# Patient Record
Sex: Female | Born: 1980 | Race: Black or African American | Hispanic: No | Marital: Single | Smoking: Never smoker
Health system: Southern US, Community
[De-identification: ages and names within clinical notes are randomized; demographics above are authoritative.]

## PROBLEM LIST (undated history)

## (undated) DIAGNOSIS — F32A Depression, unspecified: Secondary | ICD-10-CM

## (undated) DIAGNOSIS — F329 Major depressive disorder, single episode, unspecified: Secondary | ICD-10-CM

## (undated) DIAGNOSIS — J45909 Unspecified asthma, uncomplicated: Secondary | ICD-10-CM

## (undated) DIAGNOSIS — K219 Gastro-esophageal reflux disease without esophagitis: Secondary | ICD-10-CM

## (undated) DIAGNOSIS — E039 Hypothyroidism, unspecified: Secondary | ICD-10-CM

## (undated) DIAGNOSIS — D649 Anemia, unspecified: Secondary | ICD-10-CM

## (undated) DIAGNOSIS — Z9289 Personal history of other medical treatment: Secondary | ICD-10-CM

## (undated) HISTORY — PX: UPPER GI ENDOSCOPY: SHX6162

---

## 1999-02-03 ENCOUNTER — Other Ambulatory Visit: Admission: RE | Admit: 1999-02-03 | Discharge: 1999-02-03 | Payer: Self-pay | Admitting: *Deleted

## 2005-12-03 ENCOUNTER — Emergency Department (HOSPITAL_COMMUNITY): Admission: EM | Admit: 2005-12-03 | Discharge: 2005-12-04 | Payer: Self-pay | Admitting: Emergency Medicine

## 2005-12-04 ENCOUNTER — Emergency Department (HOSPITAL_COMMUNITY): Admission: EM | Admit: 2005-12-04 | Discharge: 2005-12-04 | Payer: Self-pay | Admitting: Emergency Medicine

## 2006-02-08 ENCOUNTER — Other Ambulatory Visit: Admission: RE | Admit: 2006-02-08 | Discharge: 2006-02-08 | Payer: Self-pay | Admitting: Family Medicine

## 2006-03-03 ENCOUNTER — Encounter: Admission: RE | Admit: 2006-03-03 | Discharge: 2006-03-03 | Payer: Self-pay | Admitting: Family Medicine

## 2006-10-25 ENCOUNTER — Other Ambulatory Visit: Admission: RE | Admit: 2006-10-25 | Discharge: 2006-10-25 | Payer: Self-pay | Admitting: Family Medicine

## 2007-01-01 ENCOUNTER — Emergency Department (HOSPITAL_COMMUNITY): Admission: EM | Admit: 2007-01-01 | Discharge: 2007-01-01 | Payer: Self-pay | Admitting: Emergency Medicine

## 2007-02-22 IMAGING — US US ABDOMEN COMPLETE
1 series · 14 of 25 positions shown · non-contrast
Comparison: None.

CLINICAL DATA: 24-year-old female with severe abdominal pain.  
ABDOMEN ULTRASOUND:
TECHNIQUE: Complete abdominal ultrasound examination was performed including evaluation of the liver, gallbladder, bile ducts, pancreas, kidneys, spleen, IVC, and abdominal aorta.

[Series 1: abdomen · 0.28mm/px · 14 of 78 slices shown]
[im 1/78]
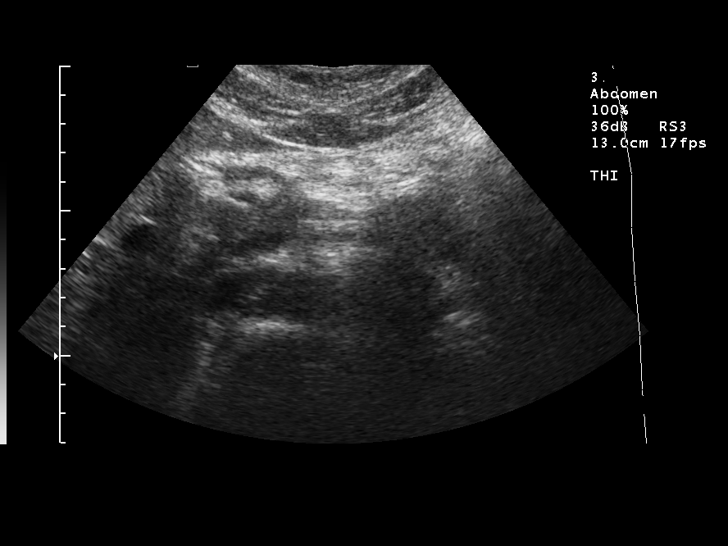
[im 7/78]
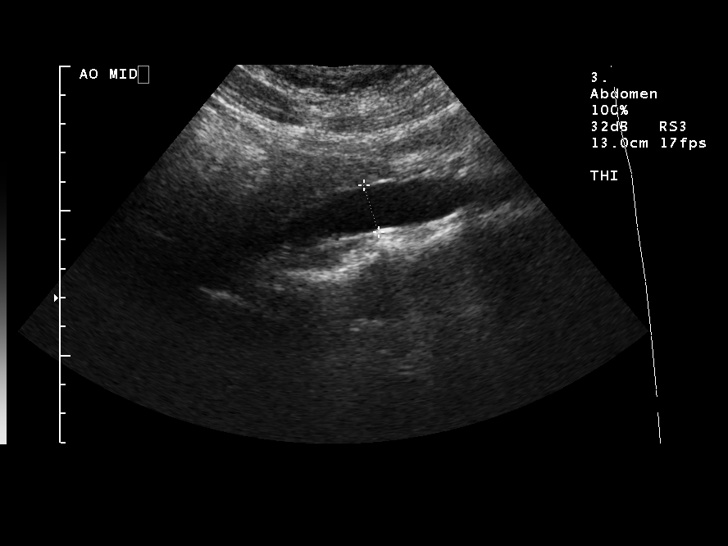
[im 13/78]
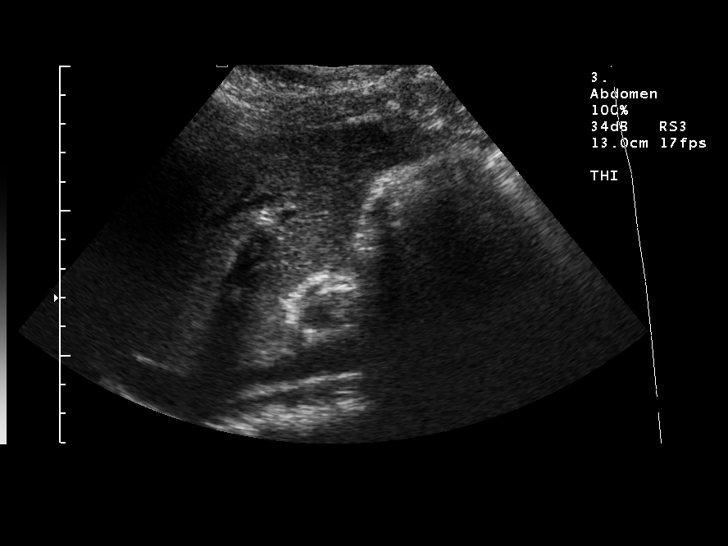
[im 20/78]
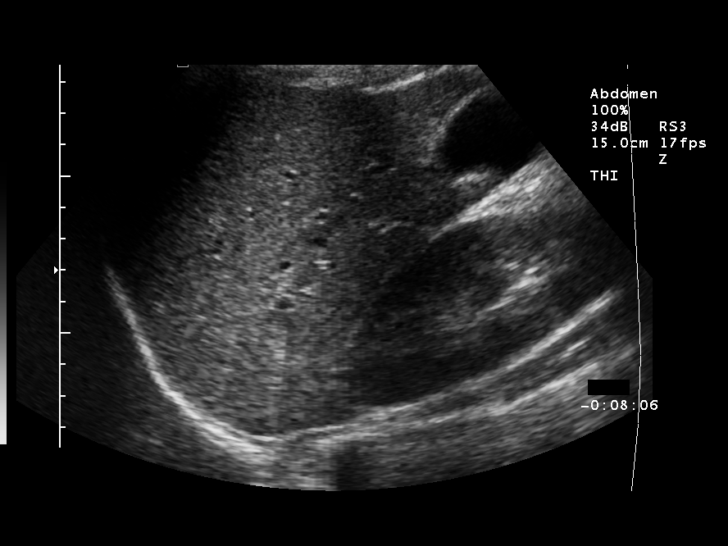
[im 26/78]
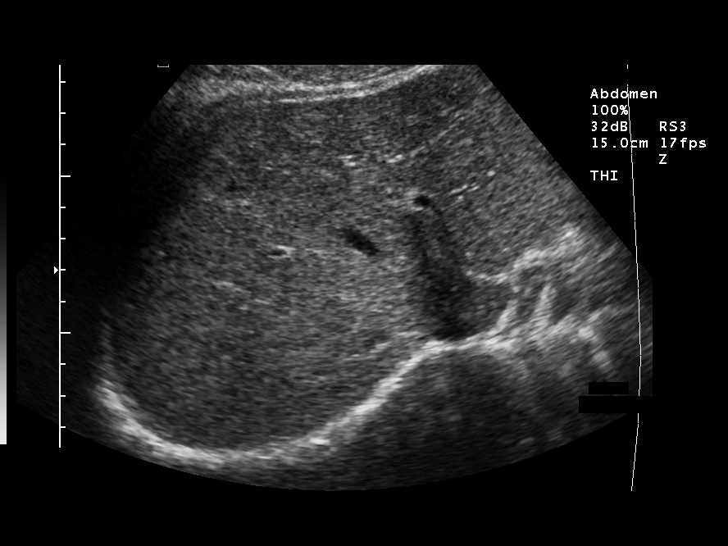
[im 29/78]
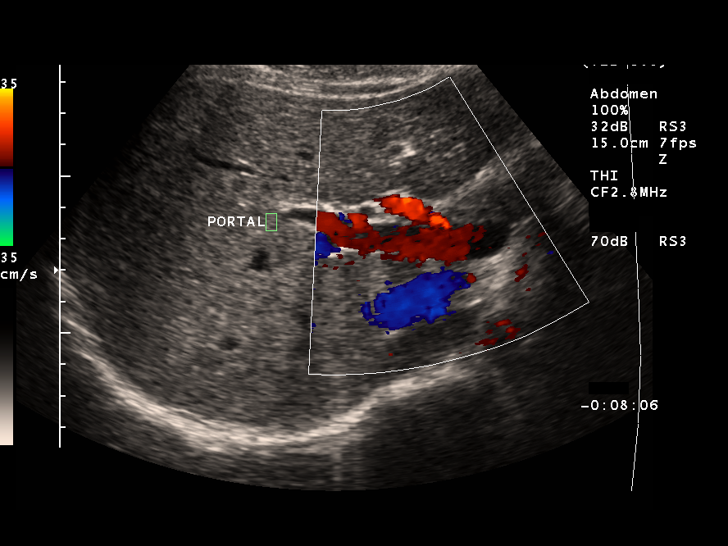
[im 36/78]
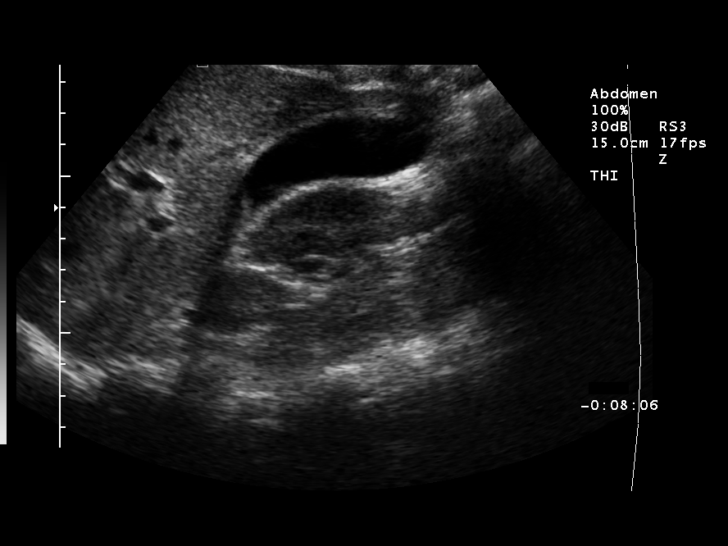
[im 42/78]
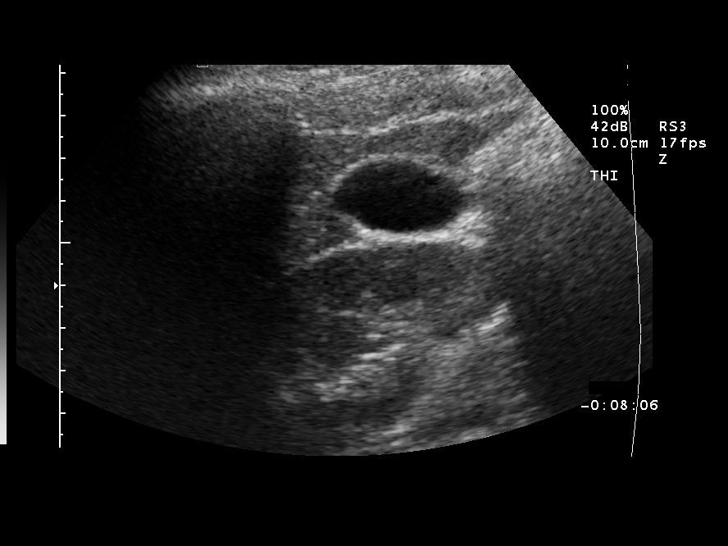
[im 49/78]
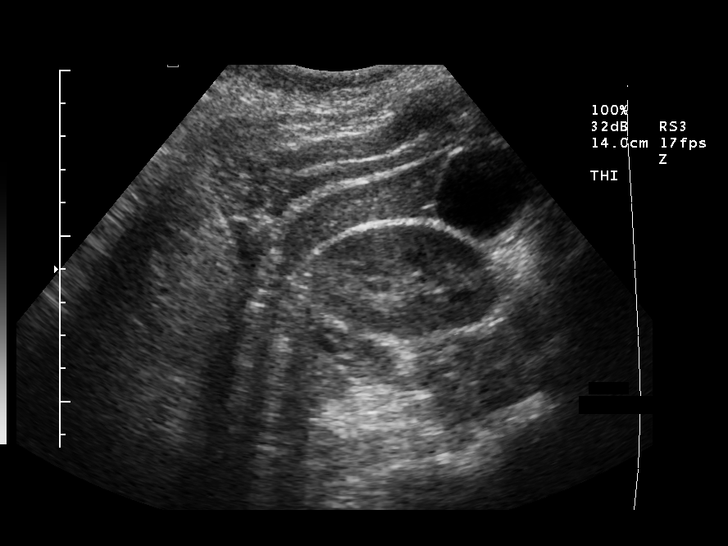
[im 52/78]
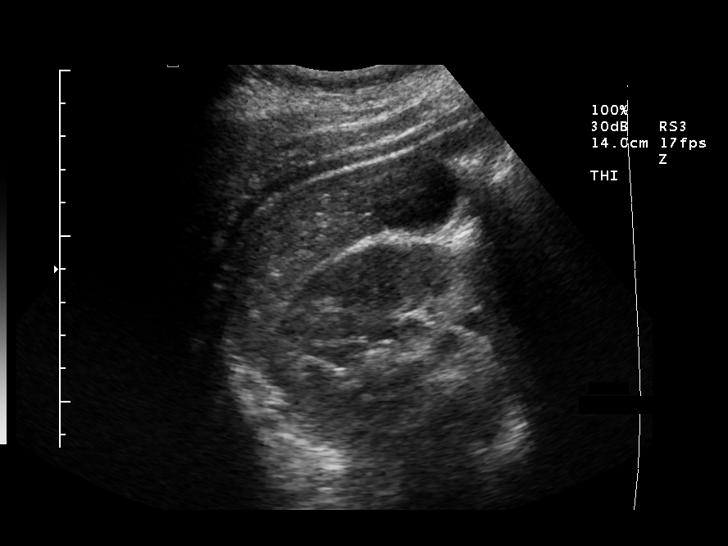
[im 58/78]
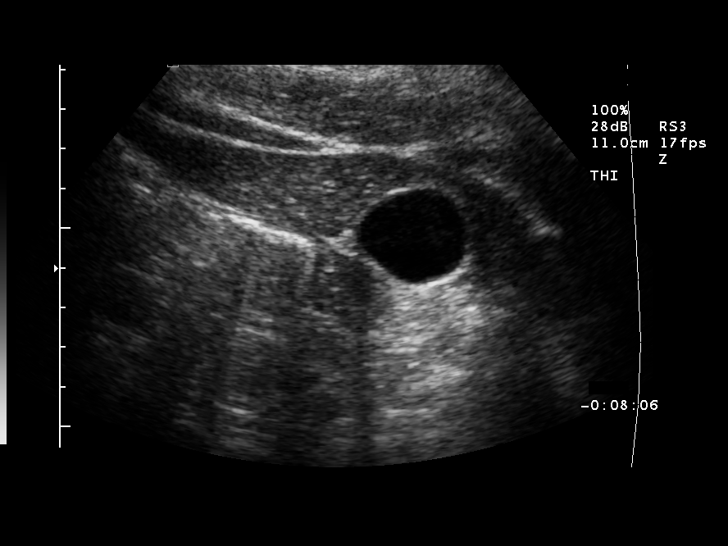
[im 65/78]
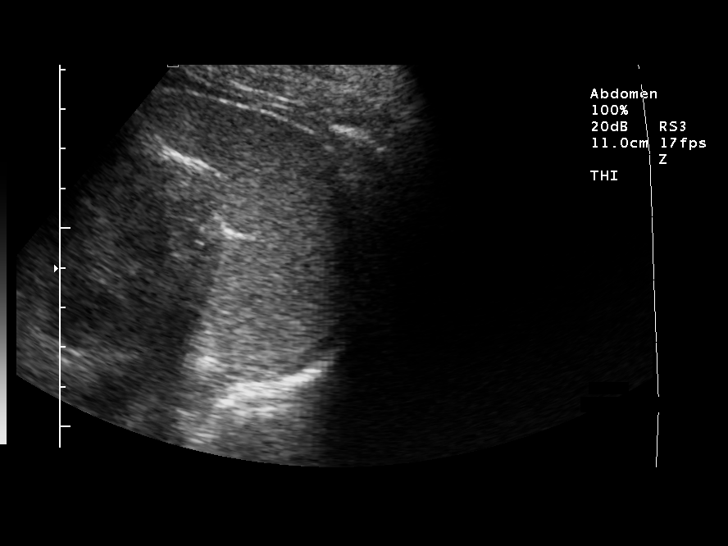
[im 71/78]
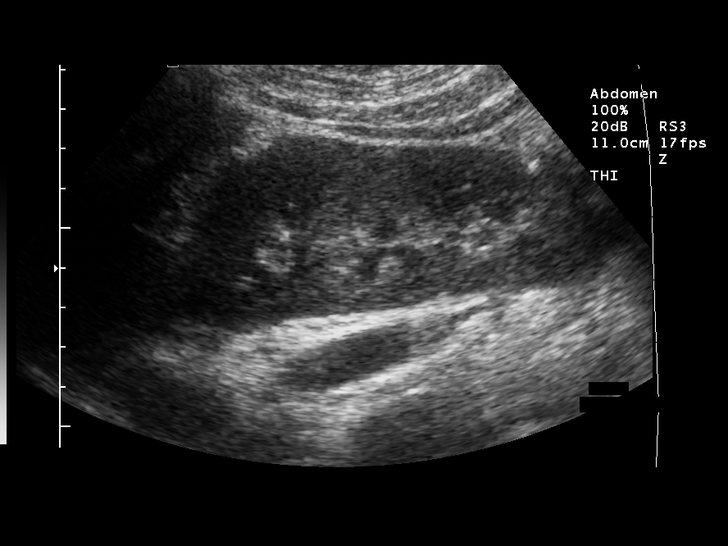
[im 78/78]
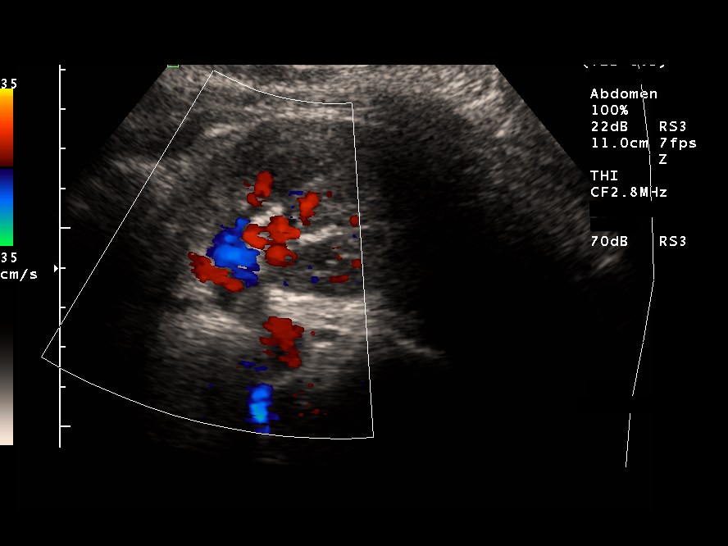

[14 of 25 positions shown; findings below may reference images not displayed]

FINDINGS: The gallbladder is normal without evidence of stone disease, intraluminal sludge, Murphy?s sign, or wall thickening.  Gallbladder wall thickness is 1.6 mm.  Common bile duct diameter is normal at 2.9 mm.  The imaged portions of the liver, pancreas, spleen, kidneys, aorta, and IVC are within normal limits.  Liver measures 14 cm in length.  Spleen measures 7.5 cm.  Right kidney measures 11 cm and left kidney measures 11.6 cm.  Aorta has a maximal diameter proximally of 2.2 cm.  No renal obstruction or hydronephrosis.  No aneurysm or ascites.
IMPRESSION: 1.  No acute finding by abdominal ultrasound.  
2.  No evidence of gallbladder pathology by ultrasound.

## 2008-01-23 ENCOUNTER — Emergency Department (HOSPITAL_COMMUNITY): Admission: EM | Admit: 2008-01-23 | Discharge: 2008-01-23 | Payer: Self-pay | Admitting: Emergency Medicine

## 2008-02-10 ENCOUNTER — Emergency Department (HOSPITAL_COMMUNITY): Admission: EM | Admit: 2008-02-10 | Discharge: 2008-02-10 | Payer: Self-pay | Admitting: Family Medicine

## 2008-06-04 ENCOUNTER — Other Ambulatory Visit: Admission: RE | Admit: 2008-06-04 | Discharge: 2008-06-04 | Payer: Self-pay | Admitting: Family Medicine

## 2008-08-26 ENCOUNTER — Emergency Department (HOSPITAL_COMMUNITY): Admission: EM | Admit: 2008-08-26 | Discharge: 2008-08-26 | Payer: Self-pay | Admitting: Emergency Medicine

## 2009-04-12 IMAGING — US US PELVIS COMPLETE MODIFY
1 series · 14 of 25 positions shown · non-contrast
Comparison: None.

CLINICAL DATA: 27-year-old female with abdominal pain and nausea, right-sided pelvic pain. 
 TRANSABDOMINAL AND TRANSVAGINAL PELVIC ULTRASOUND:
TECHNIQUE: Both transabdominal and transvaginal ultrasound examinations of the pelvis were performed including evaluation of the uterus, ovaries, adnexal regions, and pelvic cul-de-sac.

[Series 1: unknown · 0.25mm/px · 14 of 42 slices shown]
[im 1/42]
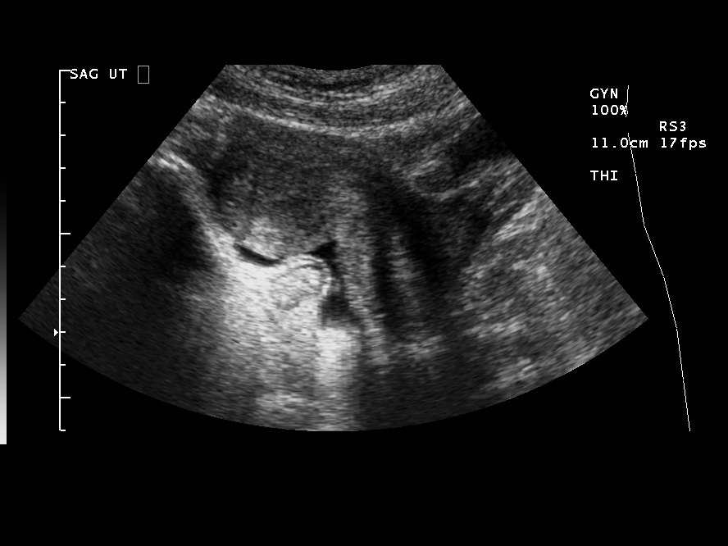
[im 4/42]
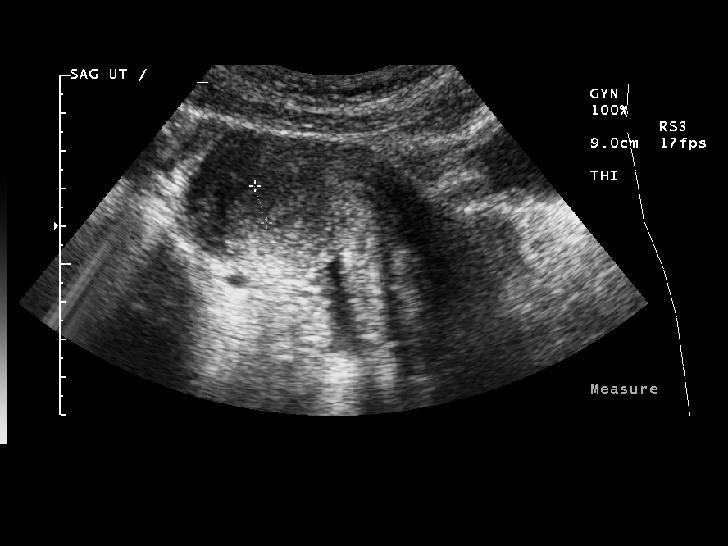
[im 7/42]
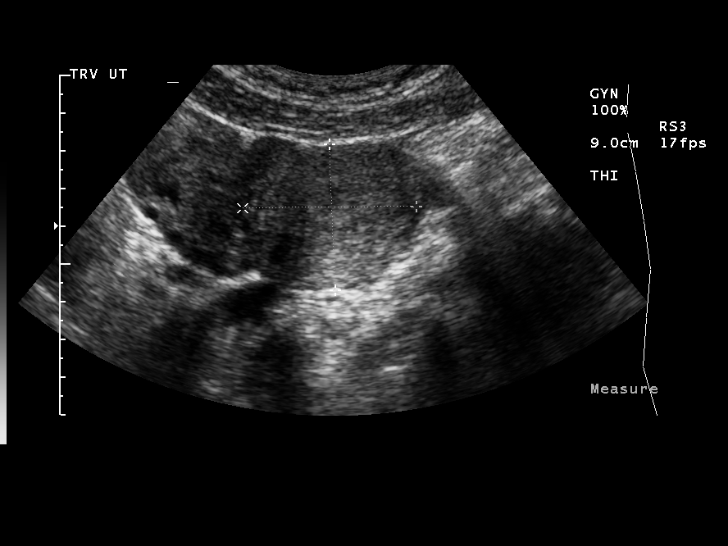
[im 11/42]
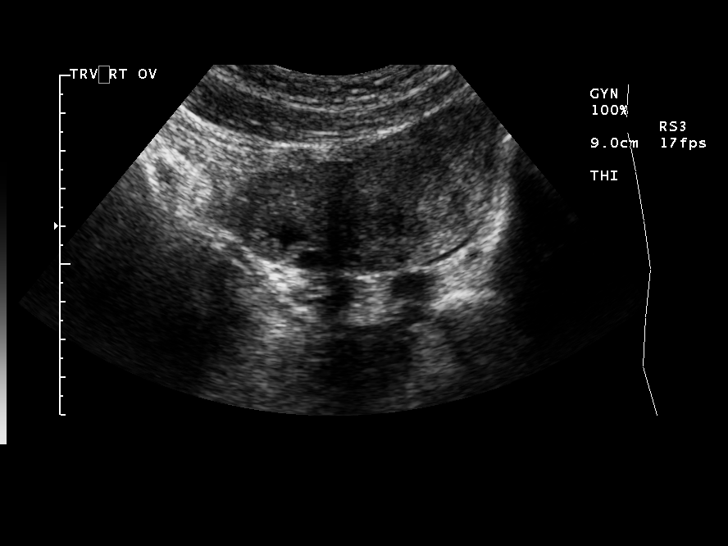
[im 14/42]
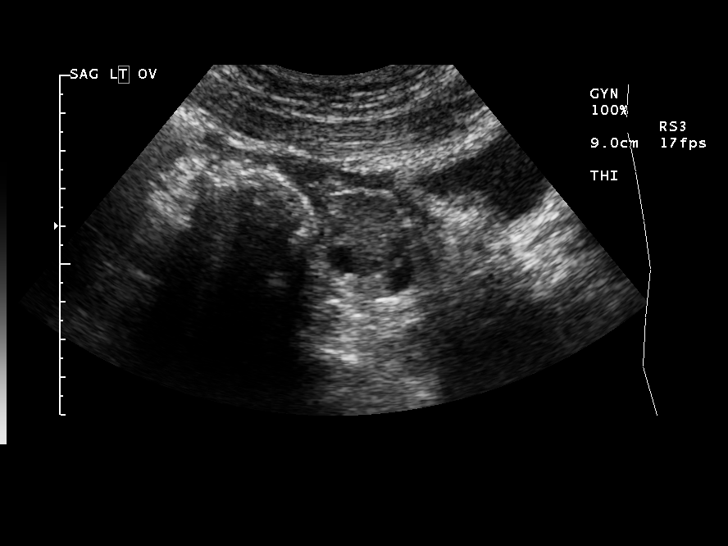
[im 16/42]
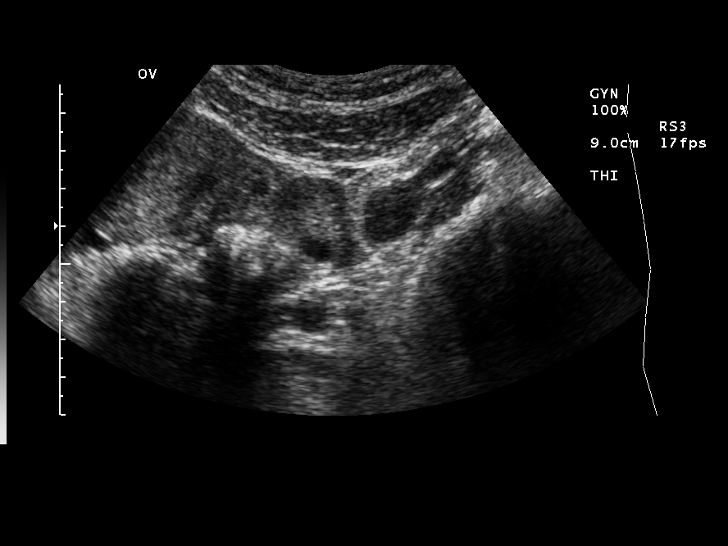
[im 19/42]
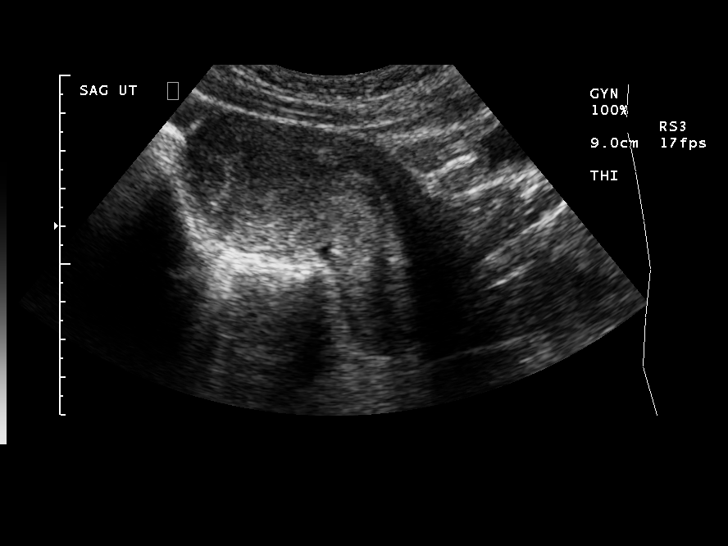
[im 23/42]
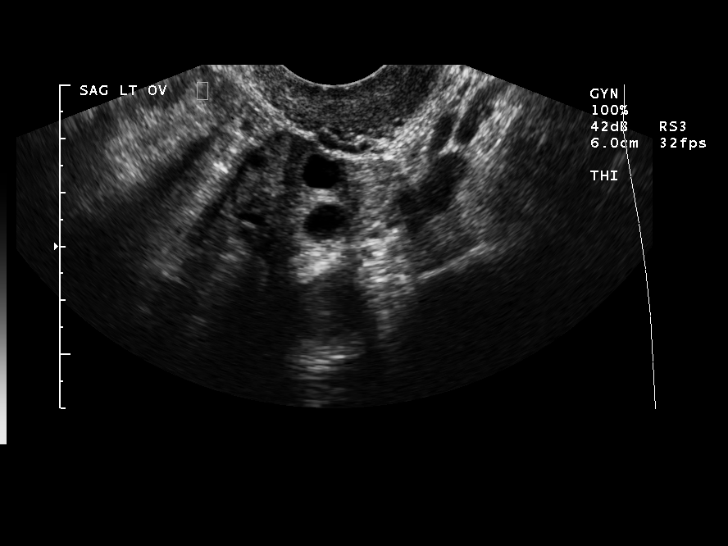
[im 26/42]
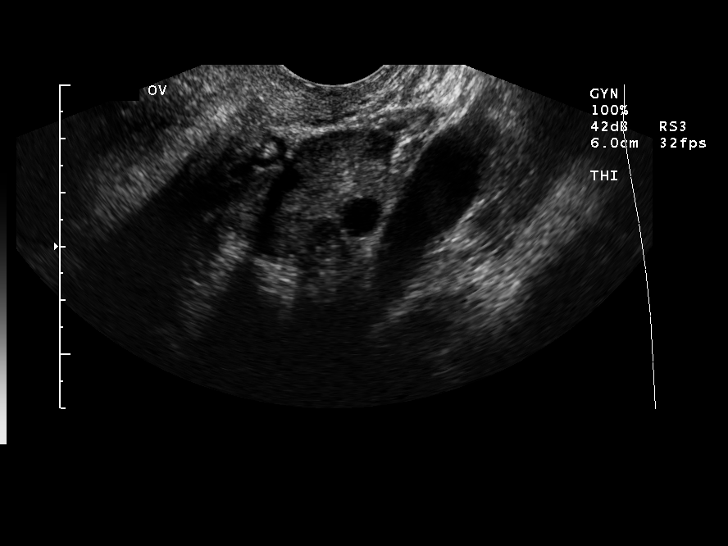
[im 28/42]
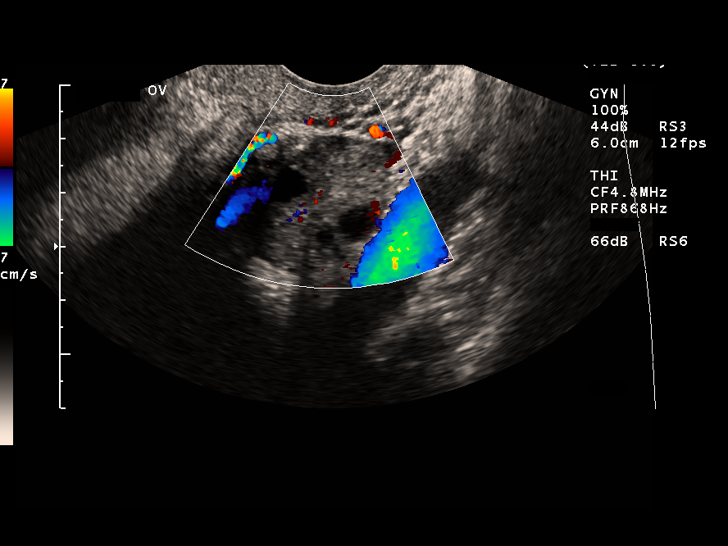
[im 31/42]
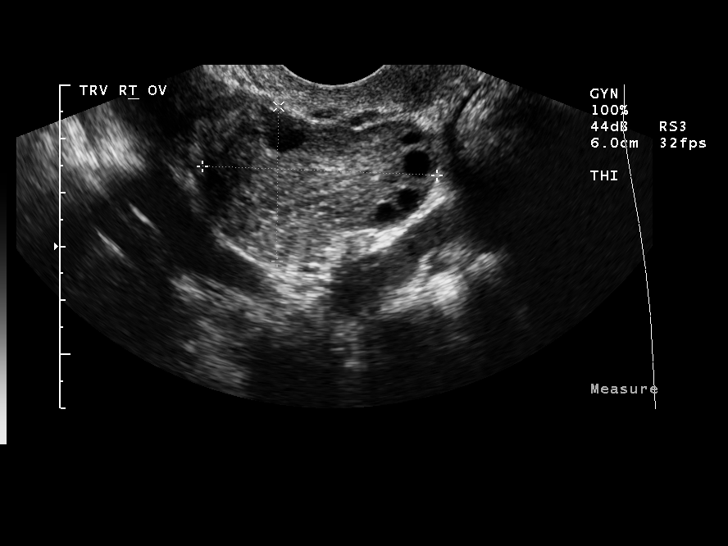
[im 35/42]
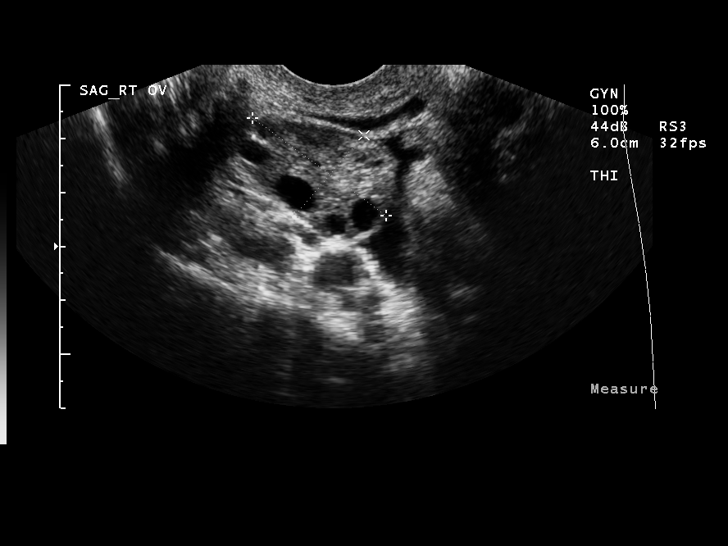
[im 38/42]
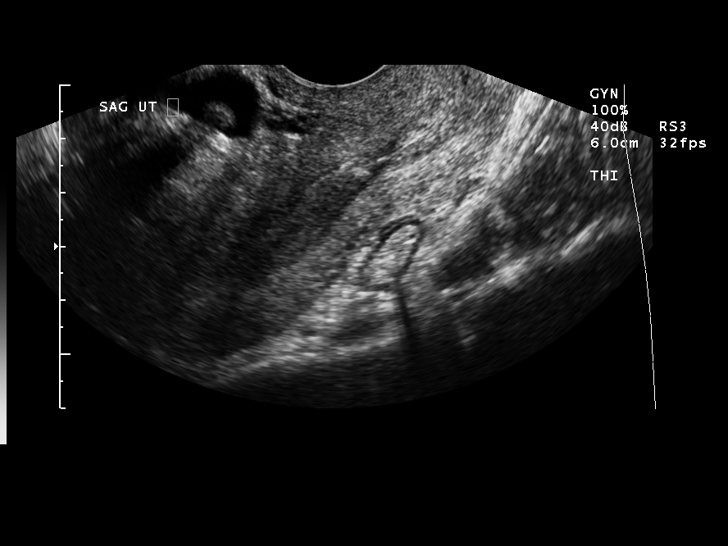
[im 42/42]
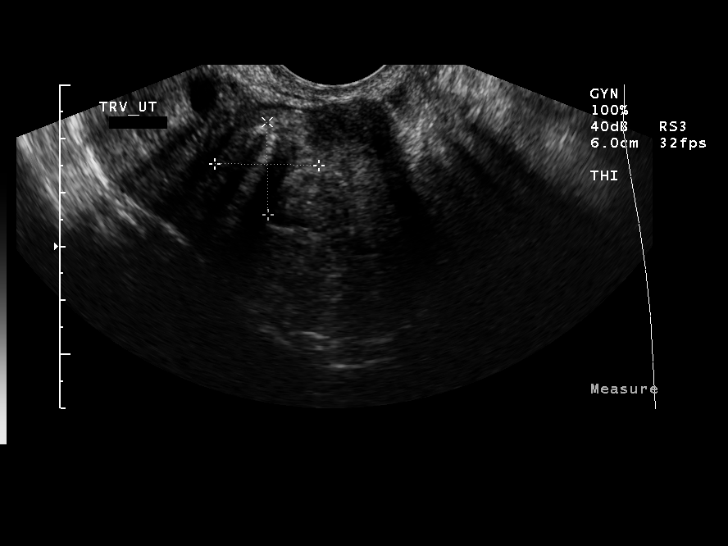

[14 of 25 positions shown; findings below may reference images not displayed]

FINDINGS: The uterus measures 8.3 x 4.6 x 3.9 cm.  There is a mild contour deformity at the uterine fundus on the right measuring 20 x 19 x 18 mm compatible with a small fibroid.  The endometrial thickness ranges from 2-10 mm.  
 There is a small volume of pelvic free fluid.  The right ovary contains multiple follicles and measures 4.4 x 3.1 x 2.9 cm. There is normal right ovarian vascularity.  The left ovary contains multiple follicles and measures 3.2 x 2.6 x 2.3 cm.  There is normal left ovarian vascularity.
IMPRESSION: 1.  Normal bilateral vascularity and ultrasound appearance of the ovaries.
 2.  2 cm uterine fibroid at the uterine fundus suspected.
 3.  Small volume pelvic free fluid.

## 2009-11-23 ENCOUNTER — Other Ambulatory Visit: Admission: RE | Admit: 2009-11-23 | Discharge: 2009-11-23 | Payer: Self-pay | Admitting: Family Medicine

## 2010-04-15 ENCOUNTER — Emergency Department (HOSPITAL_COMMUNITY): Admission: EM | Admit: 2010-04-15 | Discharge: 2010-04-15 | Payer: Self-pay | Admitting: Family Medicine

## 2011-01-31 LAB — POCT URINALYSIS DIP (DEVICE)
Bilirubin Urine: NEGATIVE
Glucose, UA: NEGATIVE mg/dL
Nitrite: NEGATIVE
Specific Gravity, Urine: 1.015 (ref 1.005–1.030)
Urobilinogen, UA: 0.2 mg/dL (ref 0.0–1.0)

## 2011-01-31 LAB — DIFFERENTIAL
Basophils Relative: 0 % (ref 0–1)
Eosinophils Absolute: 0.1 10*3/uL (ref 0.0–0.7)
Eosinophils Relative: 3 % (ref 0–5)
Monocytes Absolute: 0.3 10*3/uL (ref 0.1–1.0)
Monocytes Relative: 6 % (ref 3–12)
Neutrophils Relative %: 53 % (ref 43–77)

## 2011-01-31 LAB — POCT PREGNANCY, URINE: Preg Test, Ur: NEGATIVE

## 2011-01-31 LAB — COMPREHENSIVE METABOLIC PANEL
Albumin: 4 g/dL (ref 3.5–5.2)
Alkaline Phosphatase: 40 U/L (ref 39–117)
BUN: 8 mg/dL (ref 6–23)
CO2: 25 mEq/L (ref 19–32)
Calcium: 9 mg/dL (ref 8.4–10.5)
GFR calc Af Amer: >60 mL/min (ref >60)

## 2011-01-31 LAB — CBC
HCT: 36.8 % (ref 36.0–46.0)
Hemoglobin: 12.7 g/dL (ref 12.0–15.0)
MCV: 93.5 fL (ref 78.0–100.0)
Platelets: 291 10*3/uL (ref 150–400)
RBC: 3.93 MIL/uL (ref 3.87–5.11)

## 2011-07-04 IMAGING — CR DG ABDOMEN 2V
4 series · 4 of 4 positions shown · non-contrast
Comparison: None.

CLINICAL DATA: Vomiting for 2 days.

ABDOMEN - 2 VIEW

[view not recorded (1 of 4)]
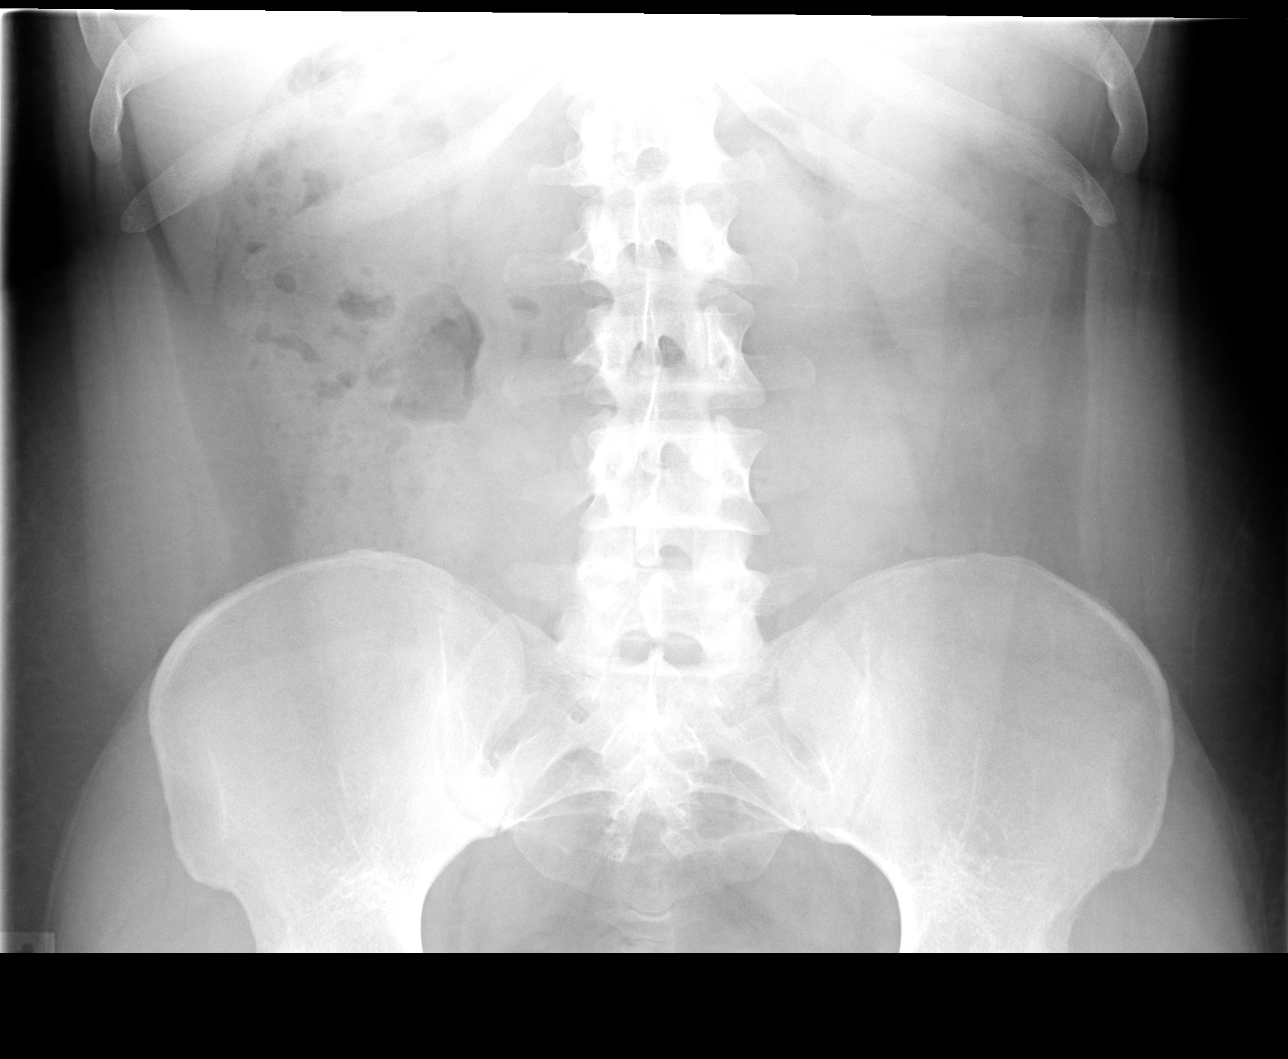

[view not recorded (2 of 4)]
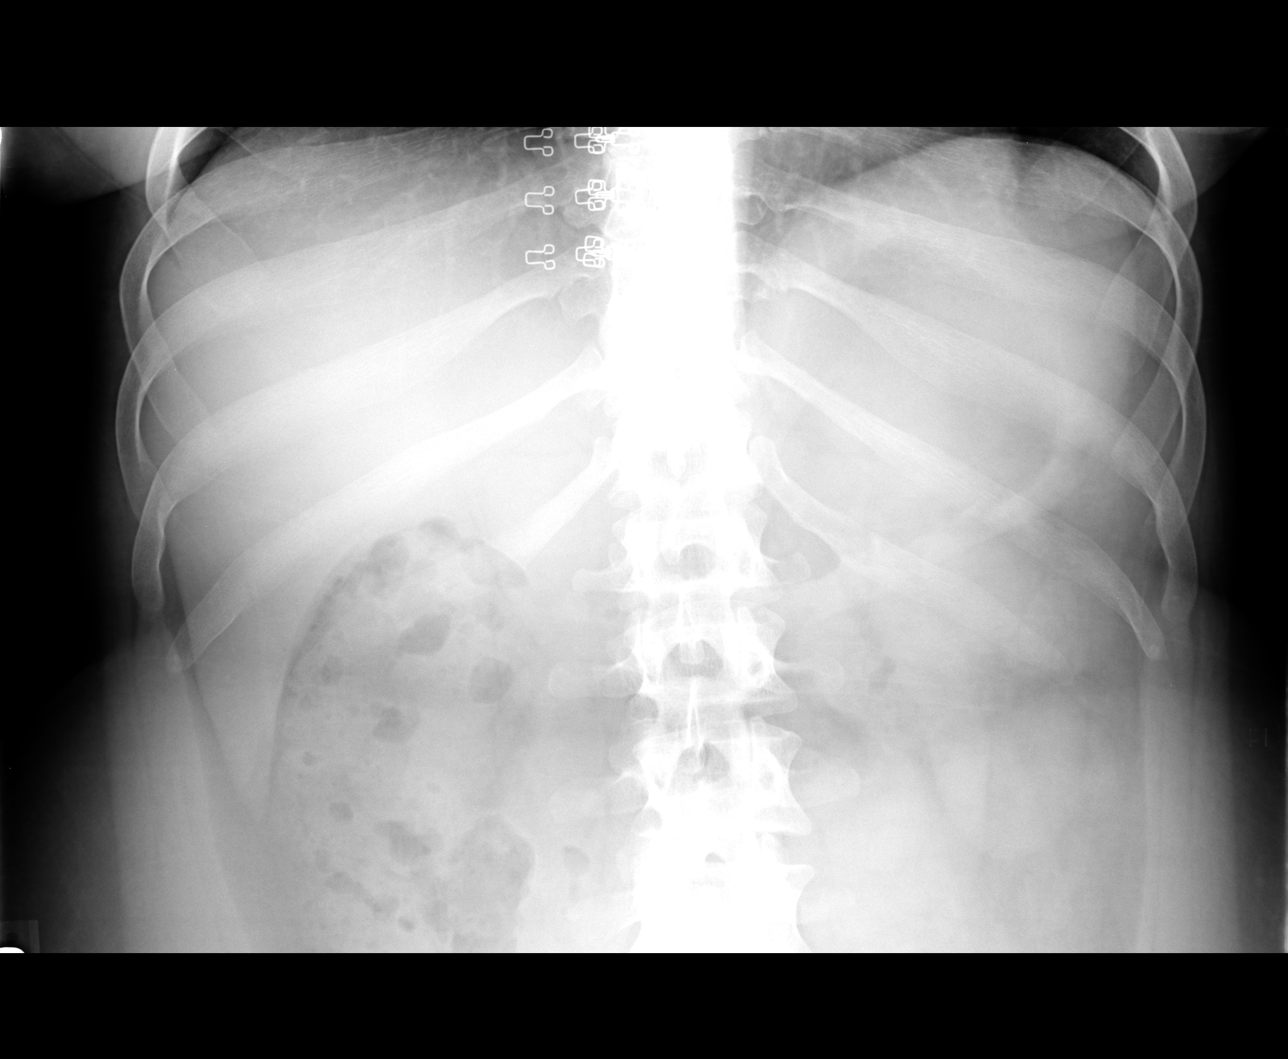

[view not recorded (3 of 4)]
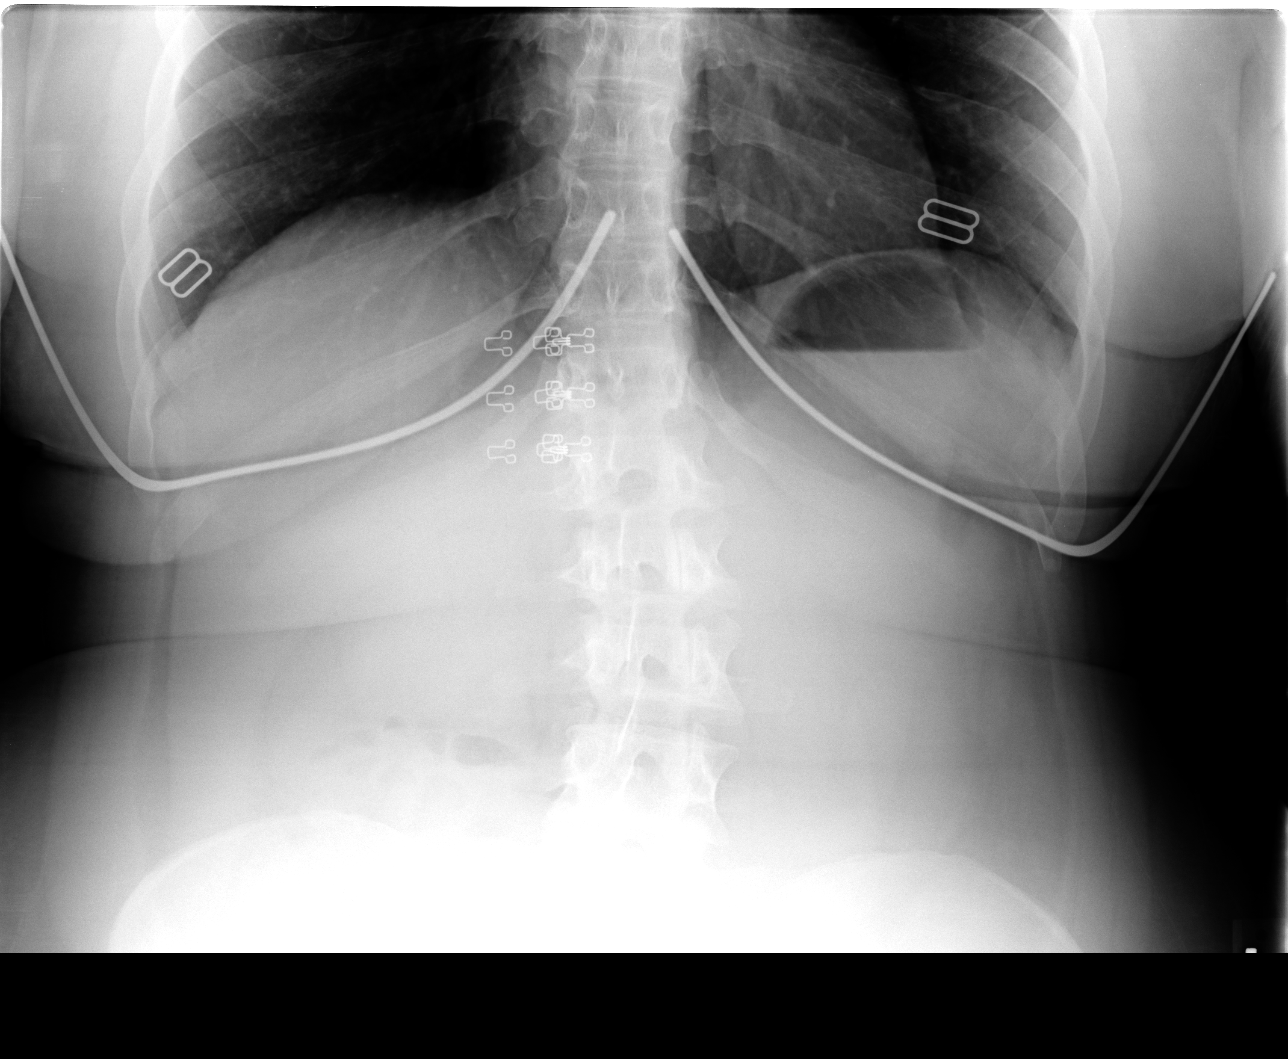

[view not recorded (4 of 4)]
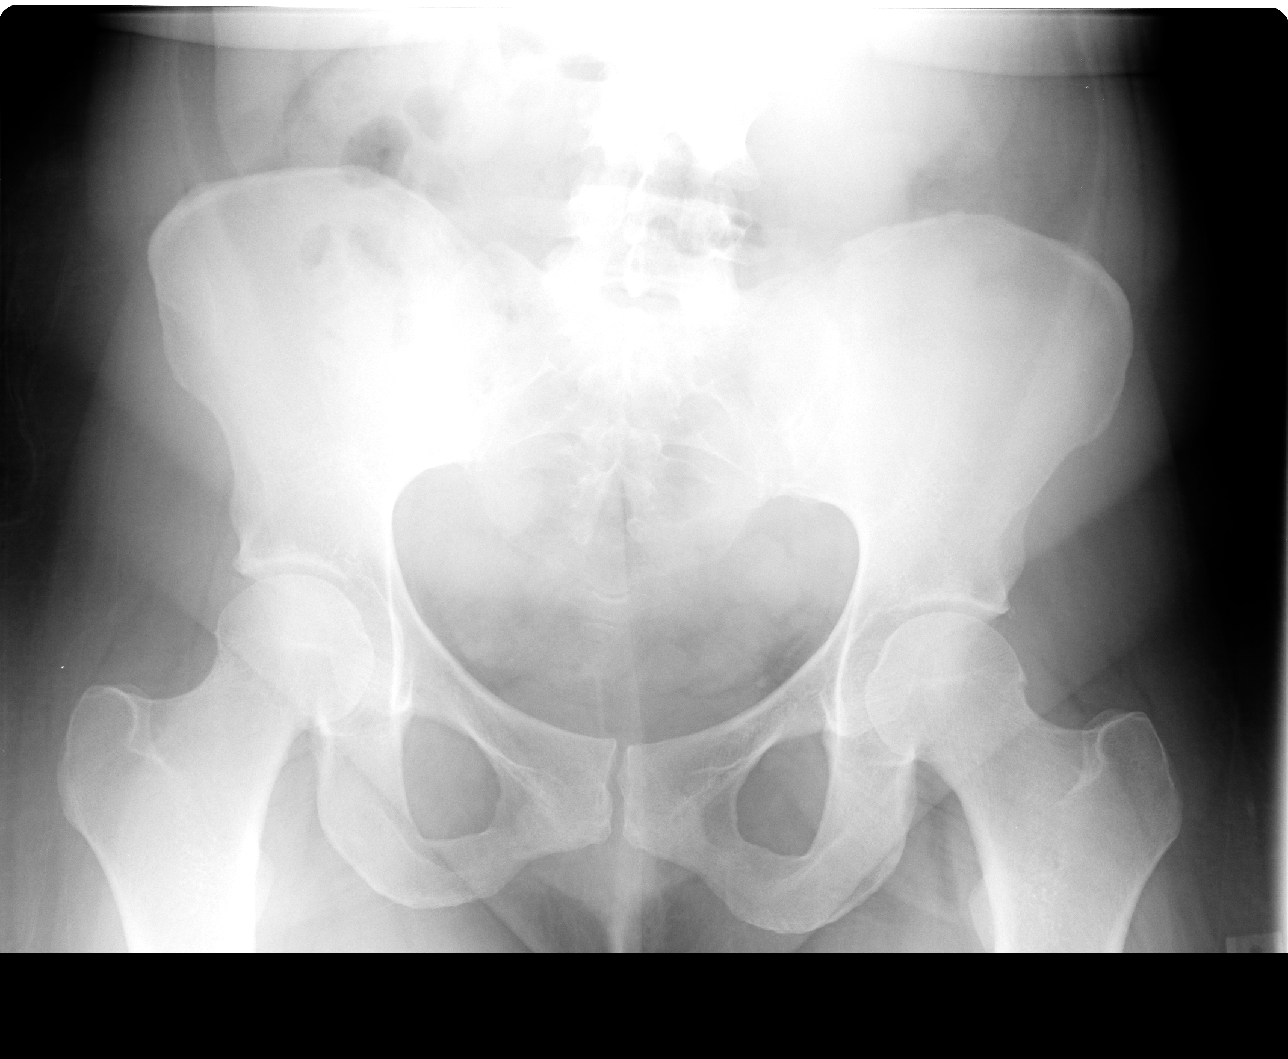

[4 of 4 positions shown; findings below may reference images not displayed]

FINDINGS: Nonspecific bowel gas pattern without plain film
evidence of bowel obstruction or free intraperitoneal air.  There
is a paucity of small bowel gas.  Fluid and gas filled stomach.
IMPRESSION: Nonspecific bowel gas pattern without plain film
evidence of bowel obstruction or free intraperitoneal air.

## 2011-08-08 LAB — POCT I-STAT CREATININE
Creatinine, Ser: 0.7
Operator id: 146091

## 2011-08-08 LAB — I-STAT 8, (EC8 V) (CONVERTED LAB)
Bicarbonate: 25.1 — ABNORMAL HIGH
Glucose, Bld: 80
HCT: 38
Hemoglobin: 12.9
Potassium: 4
TCO2: 27
pCO2, Ven: 48.5
pH, Ven: 7.321 — ABNORMAL HIGH

## 2011-08-08 LAB — URINALYSIS, ROUTINE W REFLEX MICROSCOPIC
Bilirubin Urine: NEGATIVE
Nitrite: NEGATIVE
Specific Gravity, Urine: 1.003 — ABNORMAL LOW

## 2011-08-08 LAB — POCT PREGNANCY, URINE
Operator id: 234501
Preg Test, Ur: NEGATIVE

## 2011-08-08 LAB — DIFFERENTIAL
Basophils Relative: 1
Eosinophils Absolute: 0.2
Eosinophils Relative: 3
Lymphocytes Relative: 47 — ABNORMAL HIGH
Lymphs Abs: 2.5
Neutro Abs: 2.2

## 2011-08-08 LAB — GC/CHLAMYDIA PROBE AMP, GENITAL: GC Probe Amp, Genital: NEGATIVE

## 2011-08-08 LAB — CBC
Hemoglobin: 11.3 — ABNORMAL LOW
RBC: 3.7 — ABNORMAL LOW
WBC: 5.4

## 2011-08-08 LAB — WET PREP, GENITAL

## 2011-08-15 LAB — POCT URINALYSIS DIP (DEVICE)
Bilirubin Urine: NEGATIVE
Glucose, UA: NEGATIVE
Hgb urine dipstick: NEGATIVE
Ketones, ur: NEGATIVE
Urobilinogen, UA: 0.2
pH: 6

## 2012-10-30 ENCOUNTER — Encounter (HOSPITAL_COMMUNITY): Payer: Self-pay | Admitting: Emergency Medicine

## 2012-10-30 ENCOUNTER — Emergency Department (HOSPITAL_COMMUNITY)
Admission: EM | Admit: 2012-10-30 | Discharge: 2012-10-30 | Disposition: A | Discharge status: Home / Self Care | Payer: BC Managed Care – PPO | Source: Home / Self Care

## 2012-10-30 ENCOUNTER — Other Ambulatory Visit (HOSPITAL_COMMUNITY)
Admission: RE | Admit: 2012-10-30 | Discharge: 2012-10-30 | Disposition: A | Discharge status: Home / Self Care | Payer: BC Managed Care – PPO | Source: Ambulatory Visit | Attending: Emergency Medicine | Admitting: Emergency Medicine

## 2012-10-30 DIAGNOSIS — Z113 Encounter for screening for infections with a predominantly sexual mode of transmission: Secondary | ICD-10-CM | POA: Insufficient documentation

## 2012-10-30 DIAGNOSIS — N760 Acute vaginitis: Secondary | ICD-10-CM | POA: Insufficient documentation

## 2012-10-30 HISTORY — DX: Anemia, unspecified: D64.9

## 2012-10-30 HISTORY — DX: Unspecified asthma, uncomplicated: J45.909

## 2012-10-30 HISTORY — DX: Personal history of other medical treatment: Z92.89

## 2012-10-30 LAB — POCT URINALYSIS DIP (DEVICE)
Bilirubin Urine: NEGATIVE
Glucose, UA: NEGATIVE mg/dL
Hgb urine dipstick: NEGATIVE
Protein, ur: NEGATIVE mg/dL
Specific Gravity, Urine: <=1.005 (ref 1.005–1.030)

## 2012-10-30 MED ORDER — TINIDAZOLE 500 MG PO TABS: 2.0000 g | ORAL_TABLET | Freq: Every day | ORAL | Status: DC

## 2012-10-30 NOTE — ED Provider Notes (Signed)
History     CSN: [id number]  Arrival date & time 10/30/12  1103   None     Chief Complaint  Patient presents with  . Vaginal Pain    (Consider location/radiation/quality/duration/timing/severity/associated sxs/prior treatment) HPI Comments: 31 year old female who presents with a complaint of vaginal odor. She states to have a bacterial vaginosis. She denies having vaginal discharge pelvic or vaginal pain. She is also complaining of urinary frequency but no dysuria or other urinary symptoms. She states that she has a vaginal infection approximately every 4 months, she sees her primary care physician who gives her medicine to drink as she is unable to take tablets of metronidazole. There are no complaints of abdominal pain pelvic pain, fever, nausea, vomiting, diarrhea or constipation.    Past Medical History  Diagnosis Date  . Asthma   . History of blood transfusion   . Anemia     History reviewed. No pertinent past surgical history.  No family history on file.  History  Substance Use Topics  . Smoking status: Never Smoker   . Smokeless tobacco: Not on file  . Alcohol Use: Yes    OB History    Grav Para Term Preterm Abortions TAB SAB Ect Mult Living                  Review of Systems  Constitutional: Negative for fever, activity change and fatigue.  Respiratory: Negative for shortness of breath.   Cardiovascular: Negative for chest pain.  Gastrointestinal: Negative.   Genitourinary:       As per history of present illness  Musculoskeletal: Negative.   Skin: Negative for color change, pallor and rash.  Neurological: Negative.     Allergies  Review of patient's allergies indicates no known allergies.  Home Medications   Current Outpatient Rx  Name  Route  Sig  Dispense  Refill  . LEVONORGESTREL-ETHINYL ESTRAD 0.1-20 MG-MCG PO TABS   Oral   Take 1 tablet by mouth daily.         Marland Kitchen LORAZEPAM 0.5 MG PO TABS   Oral   Take 0.5 mg by mouth every 8  (eight) hours.         Marland Kitchen ZOLOFT PO   Oral   Take 200 mg by mouth once.         Marland Kitchen TINIDAZOLE 500 MG PO TABS   Oral   Take 4 tablets (2,000 mg total) by mouth daily with breakfast. Take 2 tablets today and 2 tablets tomorrow   4 tablet   0     BP 134/69  Pulse 70  Temp 98.5 F (36.9 C) (Oral)  Resp 16  SpO2 100%  LMP 10/09/2012  Physical Exam  Nursing note and vitals reviewed. Constitutional: She is oriented to person, place, and time. She appears well-developed and well-nourished.  Neck: Normal range of motion. Neck supple.  Cardiovascular: Normal rate and normal heart sounds.   Pulmonary/Chest: Effort normal and breath sounds normal. No respiratory distress.  Abdominal: Soft. She exhibits no distension. There is no tenderness. There is no rebound and no guarding.  Genitourinary:       This was a difficult exam in that the patient was unable to relax to allow inseriont a speculum greater pain and 4 cm into the vagina. She complained of a pressure discomfort and any manipulation attempt to locate the cervix produce discomfort. The cervix was not visualized due to discomfort. Redundant vaginal walls treated with a thick creamy-like green to 10 discharge  coating the vaginal walls.  Neurological: She is alert and oriented to person, place, and time.  Skin: Skin is warm and dry. No erythema.  Psychiatric: She has a normal mood and affect.    ED Course  Procedures (including critical care time)   Labs Reviewed  POCT URINALYSIS DIP (DEVICE)  POCT PREGNANCY, URINE  CERVICOVAGINAL ANCILLARY ONLY   No results found.   1. Vaginitis       MDM  Sinemet above about difficulty inserting the speculum. The affirm swabs and DNA for GC and Chlamydia were obtained. I did not appreciate a foul odor. History of attention a max 2 g by mouth today and 2 g by mouth tomorrow. She should followup with her PCP as necessary. Results for orders placed during the hospital encounter of  10/30/12  POCT URINALYSIS DIP (DEVICE)      Component Value Range   Glucose, UA NEGATIVE  NEGATIVE mg/dL   Bilirubin Urine NEGATIVE  NEGATIVE   Ketones, ur NEGATIVE  NEGATIVE mg/dL   Specific Gravity, Urine <=1.005  1.005 - 1.030   Hgb urine dipstick NEGATIVE  NEGATIVE   pH 6.5  5.0 - 8.0   Protein, ur NEGATIVE  NEGATIVE mg/dL   Urobilinogen, UA 0.2  0.0 - 1.0 mg/dL   Nitrite NEGATIVE  NEGATIVE   Leukocytes, UA NEGATIVE  NEGATIVE  POCT PREGNANCY, URINE      Component Value Range   Preg Test, Ur NEGATIVE  NEGATIVE           Hayden Rasmussen, NP 10/30/12 1332

## 2012-10-30 NOTE — ED Notes (Signed)
Reports vaginal irritation and odor and denies discharge.  Reports frequent urination.

## 2012-10-30 NOTE — ED Notes (Signed)
Patient instructed to undress and equipment at bedside

## 2012-10-30 NOTE — ED Provider Notes (Signed)
Medical screening examination/treatment/procedure(s) were performed by non-physician practitioner and as supervising physician I was immediately available for consultation/collaboration.  Raynald Blend, MD 10/30/12 1354

## 2012-10-30 NOTE — ED Notes (Signed)
Patient obtaining specimen.  Verified with david, np obtain clean catch urine.

## 2013-12-04 ENCOUNTER — Other Ambulatory Visit (HOSPITAL_COMMUNITY)
Admission: RE | Admit: 2013-12-04 | Discharge: 2013-12-04 | Disposition: A | Discharge status: Home / Self Care | Payer: BC Managed Care – PPO | Source: Ambulatory Visit | Attending: Family Medicine | Admitting: Family Medicine

## 2013-12-04 ENCOUNTER — Other Ambulatory Visit: Payer: Self-pay | Admitting: Family Medicine

## 2013-12-04 DIAGNOSIS — Z1151 Encounter for screening for human papillomavirus (HPV): Secondary | ICD-10-CM | POA: Insufficient documentation

## 2013-12-04 DIAGNOSIS — Z113 Encounter for screening for infections with a predominantly sexual mode of transmission: Secondary | ICD-10-CM | POA: Insufficient documentation

## 2013-12-04 DIAGNOSIS — Z124 Encounter for screening for malignant neoplasm of cervix: Secondary | ICD-10-CM | POA: Insufficient documentation

## 2014-12-19 ENCOUNTER — Other Ambulatory Visit (HOSPITAL_COMMUNITY)
Admission: RE | Admit: 2014-12-19 | Discharge: 2014-12-19 | Disposition: A | Discharge status: Home / Self Care | Payer: BLUE CROSS/BLUE SHIELD | Source: Ambulatory Visit | Attending: Family Medicine | Admitting: Family Medicine

## 2014-12-19 ENCOUNTER — Other Ambulatory Visit: Payer: Self-pay | Admitting: Family Medicine

## 2014-12-19 DIAGNOSIS — Z1151 Encounter for screening for human papillomavirus (HPV): Secondary | ICD-10-CM | POA: Insufficient documentation

## 2014-12-19 DIAGNOSIS — Z124 Encounter for screening for malignant neoplasm of cervix: Secondary | ICD-10-CM | POA: Diagnosis not present

## 2014-12-25 LAB — CYTOLOGY - PAP

## 2015-10-22 ENCOUNTER — Encounter: Payer: Self-pay | Admitting: *Deleted

## 2015-11-23 ENCOUNTER — Encounter: Payer: BLUE CROSS/BLUE SHIELD | Admitting: Obstetrics & Gynecology

## 2015-12-10 ENCOUNTER — Encounter: Payer: Self-pay | Admitting: Obstetrics and Gynecology

## 2015-12-10 ENCOUNTER — Ambulatory Visit (INDEPENDENT_AMBULATORY_CARE_PROVIDER_SITE_OTHER): Payer: BLUE CROSS/BLUE SHIELD | Admitting: Obstetrics and Gynecology

## 2015-12-10 VITALS — BP 150/80 | HR 75 | Ht 65.0 in | Wt 264.0 lb

## 2015-12-10 DIAGNOSIS — N946 Dysmenorrhea, unspecified: Secondary | ICD-10-CM | POA: Diagnosis not present

## 2015-12-10 NOTE — Progress Notes (Signed)
Patient ID: Sharon Jensen, female   DOB: December 07, 1980, 35 y.o.   MRN: [id number] 35 yo G0 presenting today for the evaluation of abnormal vaginal bleeding. Patient reports a long standing history of dysmenorrhea and heavy monthly cycles since the onset of menarche. This issue had been treated well with OCP until the past year. She describes her pain as daily cramping pain. Her flow is very light and cramps starts several days before onset of bleeding. She states that the vaginal bleeding occurs randomly within the pack of OCP and not during the placebo week. She states that her birth control pill prescription has been changed several times without improvement.   Past Medical History  Diagnosis Date  . Asthma   . History of blood transfusion   . Anemia    History reviewed. No pertinent past surgical history. History reviewed. No pertinent family history. Social History  Substance Use Topics  . Smoking status: Never Smoker   . Smokeless tobacco: Never Used  . Alcohol Use: Yes     Comment: rare   ROS See pertinent in HPI  Blood pressure 150/80, pulse 75, height 5\' 5"  (1.651 m), weight 264 lb (119.75 kg), last menstrual period 12/02/2015.  GENERAL: Well-developed, well-nourished female in no acute distress. Obese ABDOMEN: Soft, nontender, nondistended.  PELVIC: Declined by patient EXTREMITIES: No cyanosis, clubbing, or edema, 2+ distal pulses.  A/P 35 yo G0 with dysmenorrhea and irregular vaginal bleeding - Discussed the benefits of doing an endometrial biopsy. Patient would rather go in the operating room to have a D&C. Her father is a physician and stated that it would be wise to have this done. - Patient will be scheduled for D&C - Pelvic ultrasound ordered to rule out any other etiology of her AUB - I also explained to the patient that the [employer], although it may solve her irregular bleeding cycle, may not have an impact on her daily cramps. She verbalized understanding.  - Risks, benefits  and alternatives were explained including but not limited to risks of bleeding, infection, uterine perforation and damage to adjacent organs. Patient verbalized understanding and all questions were answered.

## 2015-12-10 NOTE — Patient Instructions (Signed)

## 2015-12-16 ENCOUNTER — Encounter (HOSPITAL_COMMUNITY): Payer: Self-pay | Admitting: *Deleted

## 2015-12-17 ENCOUNTER — Ambulatory Visit (HOSPITAL_COMMUNITY)
Admission: RE | Admit: 2015-12-17 | Discharge: 2015-12-17 | Disposition: A | Discharge status: Home / Self Care | Payer: BLUE CROSS/BLUE SHIELD | Source: Ambulatory Visit | Attending: Obstetrics and Gynecology | Admitting: Obstetrics and Gynecology

## 2015-12-17 DIAGNOSIS — N926 Irregular menstruation, unspecified: Secondary | ICD-10-CM | POA: Insufficient documentation

## 2015-12-17 DIAGNOSIS — N946 Dysmenorrhea, unspecified: Secondary | ICD-10-CM

## 2015-12-17 DIAGNOSIS — D252 Subserosal leiomyoma of uterus: Secondary | ICD-10-CM | POA: Diagnosis not present

## 2015-12-17 DIAGNOSIS — D649 Anemia, unspecified: Secondary | ICD-10-CM | POA: Diagnosis not present

## 2015-12-18 ENCOUNTER — Telehealth: Payer: Self-pay

## 2015-12-18 NOTE — Telephone Encounter (Signed)
Pt has been informed of normal size uterus and small fibroids.

## 2015-12-21 ENCOUNTER — Telehealth: Payer: Self-pay

## 2015-12-21 NOTE — Telephone Encounter (Addendum)
Received message from Radiology to inform pt of [place] results.  Called pt and LM to return call to the Clinics.

## 2015-12-22 NOTE — Telephone Encounter (Signed)
Per previous encounter patient has already been notified of results.

## 2016-01-12 ENCOUNTER — Encounter (HOSPITAL_COMMUNITY): Payer: Self-pay

## 2016-01-12 ENCOUNTER — Encounter (HOSPITAL_COMMUNITY)
Admission: RE | Admit: 2016-01-12 | Discharge: 2016-01-12 | Disposition: A | Discharge status: Home / Self Care | Payer: BLUE CROSS/BLUE SHIELD | Source: Ambulatory Visit | Attending: Obstetrics and Gynecology | Admitting: Obstetrics and Gynecology

## 2016-01-12 DIAGNOSIS — Z01812 Encounter for preprocedural laboratory examination: Secondary | ICD-10-CM | POA: Diagnosis present

## 2016-01-12 HISTORY — DX: Gastro-esophageal reflux disease without esophagitis: K21.9

## 2016-01-12 HISTORY — DX: Hypothyroidism, unspecified: E03.9

## 2016-01-12 HISTORY — DX: Major depressive disorder, single episode, unspecified: F32.9

## 2016-01-12 HISTORY — DX: Depression, unspecified: F32.A

## 2016-01-12 LAB — CBC
HEMATOCRIT: 39.1 % (ref 36.0–46.0)
Hemoglobin: 12.9 g/dL (ref 12.0–15.0)
MCH: 30.9 pg (ref 26.0–34.0)
MCHC: 33 g/dL (ref 30.0–36.0)
MCV: 93.5 fL (ref 78.0–100.0)
PLATELETS: 328 10*3/uL (ref 150–400)
RBC: 4.18 MIL/uL (ref 3.87–5.11)
RDW: 13.2 % (ref 11.5–15.5)
WBC: 4.8 10*3/uL (ref 4.0–10.5)

## 2016-01-12 NOTE — Patient Instructions (Addendum)
Your procedure is scheduled on:  Tuesday, January 19, 2016  Enter through the Main Entrance of [facility] at:  11:30 AM  Pick up the phone at the desk and dial [phone].  Call this number if you have problems the morning of surgery: [phone].  Remember:  Do NOT eat food:  After Midnight Monday, January 18, 2016  Do NOT drink clear liquids after:  9:00 AM day of surgery  Take these medicines the morning of surgery with a SIP OF WATER:  None  Bring Asthma in haler day of surgery  Do NOT wear jewelry (body piercing), metal hair clips/bobby pins, make-up, or nail polish. Do NOT wear lotions, powders, or perfumes.  You may wear deoderant. Do NOT shave for 48 hours prior to surgery. Do NOT bring valuables to the hospital. Contacts, dentures, or bridgework may not be worn into surgery.  Have a responsible adult drive you home and stay with you for 24 hours after your procedure

## 2016-01-18 NOTE — H&P (Signed)
Sharon Jensen is an 35 y.o. female G0 presenting today for a scheduled D&C with hysteroscopy. Patient with a long standing history of menorrhagia since menarche. She has been medically treated with different OCPs but lately has been experiencing irregular bleeding while on OCP.  Pertinent Gynecological History: Menses: irregular bleeding and spotting while on OCP Bleeding: dysfunctional uterine bleeding Contraception: OCP (estrogen/progesterone) DES exposure: denies Blood transfusions: none Sexually transmitted diseases: no past history Previous GYN Procedures: none  Last mammogram: n/a Last pap: normal Date: 12/20/2014 OB History: G0, P0   Menstrual History:  Patient's last menstrual period was 12/22/2015.    Past Medical History  Diagnosis Date  . Asthma   . History of blood transfusion   . Anemia   . Hypothyroidism     slight low, taking vitamins, will followup  . Depression   . GERD (gastroesophageal reflux disease)     Past Surgical History  Procedure Laterality Date  . Upper gi endoscopy      History reviewed. No pertinent family history.  Social History:  reports that she has never smoked. She has never used smokeless tobacco. She reports that she drinks alcohol. She reports that she does not use illicit drugs.  Allergies: No Known Allergies  Prescriptions prior to admission  Medication Sig Dispense Refill Last Dose  . albuterol (PROVENTIL HFA;VENTOLIN HFA) 108 (90 Base) MCG/ACT inhaler Inhale 1-2 puffs into the lungs every 6 (six) hours as needed for wheezing or shortness of breath.   01/19/2016 at Unknown time  . cetirizine (ZYRTEC) 10 MG tablet Take 10 mg by mouth daily as needed for allergies.   Past Week at Unknown time  . ibuprofen (ADVIL,MOTRIN) 200 MG tablet Take 1,000 mg by mouth every 6 (six) hours as needed for moderate pain.     Marland Kitchen levonorgestrel-ethinyl estradiol (FALMINA) 0.1-20 MG-MCG tablet Take 1 tablet by mouth daily.   01/18/2016 at Unknown time  .  naproxen sodium (ANAPROX) 220 MG tablet Take 660 mg by mouth 2 (two) times daily as needed (moderate pain and cramps).   01/18/2016 at Unknown time  . omeprazole (PRILOSEC) 20 MG capsule Take 20 mg by mouth daily.   01/18/2016 at Unknown time  . sertraline (ZOLOFT) 100 MG tablet Take 100 mg by mouth daily.   01/18/2016 at Unknown time  . Vitamin D, Ergocalciferol, (DRISDOL) 50000 units CAPS capsule Take 50,000 Units by mouth once a week. mondays  0     ROS See pertinent in HPI  Blood pressure 127/93, pulse 73, temperature 98.8 F (37.1 C), temperature source Oral, resp. rate 18, last menstrual period 12/22/2015, SpO2 100 %. Physical Exam  GENERAL: Well-developed, well-nourished female in no acute distress.  HEENT: Normocephalic, atraumatic. Sclerae anicteric.  NECK: Supple. Normal thyroid.  LUNGS: Clear to auscultation bilaterally.  HEART: Regular rate and rhythm. ABDOMEN: Soft, nontender, nondistended. No organomegaly. PELVIC: Deferred to OR EXTREMITIES: No cyanosis, clubbing, or edema, 2+ distal pulses.  Results for orders placed or performed during the hospital encounter of 01/19/16 (from the past 24 hour(s))  Pregnancy, urine     Status: None   Collection Time: 01/19/16  9:00 AM  Result Value Ref Range   Preg Test, Ur NEGATIVE NEGATIVE    12/17/2015 ultrasound FINDINGS: Uterus  Measurements: 8.6 x 4.2 x 6.7 cm. Two adjacent subserosal fibroids are seen in the left fundal region measuring 4.3 cm and 2.4 cm in maximum diameter. A smaller subserosal fibroid is seen in the posterior corpus measuring 2.0 cm in maximum  diameter.  Endometrium  Thickness: 6 mm. No focal abnormality visualized.  Right ovary  Measurements: 2.0 x 1.3 x 1.1 cm. Normal appearance/no adnexal mass.  Left ovary  Measurements: 1.9 x 1.1 x 1.0 cm. Normal appearance/no adnexal mass.  Other findings  No abnormal free fluid.  IMPRESSION: Several small uterine fibroids, as described  above.  Normal appearance of both ovaries. No adnexal mass identified.    No results found.  Assessment/Plan: 35 yo here for scheduled D&C hysteroscopy for the management of DUB - Risks, benefits and alternatives were explained including but not limited to risks of bleeding, infection, uterine perforation and damage to adjacent organs. Patient verbalized understanding and all questions were answered  Verdis Bassette 01/19/2016, 10:34 AM

## 2016-01-19 ENCOUNTER — Encounter (HOSPITAL_COMMUNITY)
Admission: RE | Disposition: A | Discharge status: Home / Self Care | Payer: Self-pay | Source: Ambulatory Visit | Attending: Obstetrics and Gynecology

## 2016-01-19 ENCOUNTER — Encounter (HOSPITAL_COMMUNITY): Payer: Self-pay | Admitting: Emergency Medicine

## 2016-01-19 ENCOUNTER — Ambulatory Visit (HOSPITAL_COMMUNITY)
Admission: RE | Admit: 2016-01-19 | Discharge: 2016-01-19 | Disposition: A | Discharge status: Home / Self Care | Payer: BLUE CROSS/BLUE SHIELD | Source: Ambulatory Visit | Attending: Obstetrics and Gynecology | Admitting: Obstetrics and Gynecology

## 2016-01-19 ENCOUNTER — Ambulatory Visit (HOSPITAL_COMMUNITY): Payer: BLUE CROSS/BLUE SHIELD | Admitting: Anesthesiology

## 2016-01-19 DIAGNOSIS — D252 Subserosal leiomyoma of uterus: Secondary | ICD-10-CM | POA: Insufficient documentation

## 2016-01-19 DIAGNOSIS — N938 Other specified abnormal uterine and vaginal bleeding: Secondary | ICD-10-CM | POA: Diagnosis not present

## 2016-01-19 DIAGNOSIS — K219 Gastro-esophageal reflux disease without esophagitis: Secondary | ICD-10-CM | POA: Diagnosis not present

## 2016-01-19 DIAGNOSIS — J45909 Unspecified asthma, uncomplicated: Secondary | ICD-10-CM | POA: Insufficient documentation

## 2016-01-19 DIAGNOSIS — E039 Hypothyroidism, unspecified: Secondary | ICD-10-CM | POA: Insufficient documentation

## 2016-01-19 HISTORY — PX: HYSTEROSCOPY W/D&C: SHX1775

## 2016-01-19 LAB — PREGNANCY, URINE: PREG TEST UR: NEGATIVE

## 2016-01-19 SURGERY — DILATATION AND CURETTAGE /HYSTEROSCOPY
Anesthesia: General | Site: Vagina

## 2016-01-19 MED ORDER — FENTANYL CITRATE (PF) 100 MCG/2ML IJ SOLN
INTRAMUSCULAR | Status: AC
  Administered 2016-01-19: 50 ug via INTRAVENOUS
  Filled 2016-01-19: qty 2

## 2016-01-19 MED ORDER — MEPERIDINE HCL 25 MG/ML IJ SOLN
INTRAMUSCULAR | Status: AC
  Administered 2016-01-19: 12.5 mg via INTRAVENOUS
  Filled 2016-01-19: qty 1

## 2016-01-19 MED ORDER — DEXAMETHASONE SODIUM PHOSPHATE 10 MG/ML IJ SOLN
INTRAMUSCULAR | Status: DC | PRN
  Administered 2016-01-19: 8 mg via INTRAVENOUS

## 2016-01-19 MED ORDER — KETOROLAC TROMETHAMINE 30 MG/ML IJ SOLN
INTRAMUSCULAR | Status: AC
  Filled 2016-01-19: qty 1

## 2016-01-19 MED ORDER — OXYCODONE-ACETAMINOPHEN 5-325 MG PO TABS: 1.0000 | ORAL_TABLET | ORAL | Status: AC | PRN

## 2016-01-19 MED ORDER — ONDANSETRON HCL 4 MG/2ML IJ SOLN
INTRAMUSCULAR | Status: AC
  Filled 2016-01-19: qty 2

## 2016-01-19 MED ORDER — DEXAMETHASONE SODIUM PHOSPHATE 10 MG/ML IJ SOLN
INTRAMUSCULAR | Status: AC
  Filled 2016-01-19: qty 1

## 2016-01-19 MED ORDER — LIDOCAINE HCL (CARDIAC) 20 MG/ML IV SOLN
INTRAVENOUS | Status: DC | PRN
  Administered 2016-01-19: 100 mg via INTRAVENOUS

## 2016-01-19 MED ORDER — LACTATED RINGERS IV SOLN
INTRAVENOUS | Status: DC
  Administered 2016-01-19 (×2): via INTRAVENOUS

## 2016-01-19 MED ORDER — IBUPROFEN 600 MG PO TABS: 600.0000 mg | ORAL_TABLET | Freq: Four times a day (QID) | ORAL | Status: AC | PRN

## 2016-01-19 MED ORDER — KETOROLAC TROMETHAMINE 30 MG/ML IJ SOLN
INTRAMUSCULAR | Status: AC
  Administered 2016-01-19: 30 mg via INTRAVENOUS
  Filled 2016-01-19: qty 1

## 2016-01-19 MED ORDER — PROPOFOL 10 MG/ML IV BOLUS
INTRAVENOUS | Status: AC
  Filled 2016-01-19: qty 20

## 2016-01-19 MED ORDER — MEPERIDINE HCL 25 MG/ML IJ SOLN
6.2500 mg | INTRAMUSCULAR | Status: DC | PRN
  Administered 2016-01-19 (×2): 12.5 mg via INTRAVENOUS

## 2016-01-19 MED ORDER — MIDAZOLAM HCL 2 MG/2ML IJ SOLN
INTRAMUSCULAR | Status: AC
  Filled 2016-01-19: qty 2

## 2016-01-19 MED ORDER — KETOROLAC TROMETHAMINE 30 MG/ML IJ SOLN
INTRAMUSCULAR | Status: DC | PRN
  Administered 2016-01-19: 30 mg via INTRAVENOUS

## 2016-01-19 MED ORDER — DEXAMETHASONE SODIUM PHOSPHATE 4 MG/ML IJ SOLN
INTRAMUSCULAR | Status: AC
  Filled 2016-01-19: qty 1

## 2016-01-19 MED ORDER — FENTANYL CITRATE (PF) 250 MCG/5ML IJ SOLN
INTRAMUSCULAR | Status: AC
  Filled 2016-01-19: qty 5

## 2016-01-19 MED ORDER — ONDANSETRON HCL 4 MG/2ML IJ SOLN
INTRAMUSCULAR | Status: DC | PRN
  Administered 2016-01-19: 4 mg via INTRAVENOUS

## 2016-01-19 MED ORDER — PHENYLEPHRINE 40 MCG/ML (10ML) SYRINGE FOR IV PUSH (FOR BLOOD PRESSURE SUPPORT)
PREFILLED_SYRINGE | INTRAVENOUS | Status: AC
  Filled 2016-01-19: qty 10

## 2016-01-19 MED ORDER — MIDAZOLAM HCL 2 MG/2ML IJ SOLN
INTRAMUSCULAR | Status: DC | PRN
  Administered 2016-01-19: 2 mg via INTRAVENOUS

## 2016-01-19 MED ORDER — SUCCINYLCHOLINE CHLORIDE 20 MG/ML IJ SOLN
INTRAMUSCULAR | Status: AC
  Filled 2016-01-19: qty 1

## 2016-01-19 MED ORDER — FENTANYL CITRATE (PF) 100 MCG/2ML IJ SOLN
INTRAMUSCULAR | Status: DC | PRN
  Administered 2016-01-19 (×2): 50 ug via INTRAVENOUS

## 2016-01-19 MED ORDER — DOCUSATE SODIUM 100 MG PO CAPS: 100.0000 mg | ORAL_CAPSULE | Freq: Two times a day (BID) | ORAL | Status: AC | PRN

## 2016-01-19 MED ORDER — ACETAMINOPHEN 10 MG/ML IV SOLN
1000.0000 mg | Freq: Four times a day (QID) | INTRAVENOUS | Status: DC
  Administered 2016-01-19: 1000 mg via INTRAVENOUS
  Filled 2016-01-19: qty 100

## 2016-01-19 MED ORDER — SODIUM CHLORIDE 0.9 % IR SOLN
Status: DC | PRN
  Administered 2016-01-19: 3000 mL

## 2016-01-19 MED ORDER — FENTANYL CITRATE (PF) 100 MCG/2ML IJ SOLN
25.0000 ug | INTRAMUSCULAR | Status: AC | PRN
  Administered 2016-01-19 (×6): 50 ug via INTRAVENOUS

## 2016-01-19 MED ORDER — SCOPOLAMINE 1 MG/3DAYS TD PT72
MEDICATED_PATCH | TRANSDERMAL | Status: AC
  Administered 2016-01-19: 1.5 mg via TRANSDERMAL
  Filled 2016-01-19: qty 1

## 2016-01-19 MED ORDER — PROPOFOL 10 MG/ML IV BOLUS
INTRAVENOUS | Status: DC | PRN
  Administered 2016-01-19: 250 mg via INTRAVENOUS
  Administered 2016-01-19: 50 mg via INTRAVENOUS

## 2016-01-19 MED ORDER — LIDOCAINE HCL (CARDIAC) 20 MG/ML IV SOLN
INTRAVENOUS | Status: AC
  Filled 2016-01-19: qty 5

## 2016-01-19 MED ORDER — KETOROLAC TROMETHAMINE 30 MG/ML IJ SOLN
30.0000 mg | Freq: Once | INTRAMUSCULAR | Status: AC
  Administered 2016-01-19: 30 mg via INTRAVENOUS

## 2016-01-19 MED ORDER — CHLOROPROCAINE HCL 1 % IJ SOLN
INTRAMUSCULAR | Status: AC
  Filled 2016-01-19: qty 30

## 2016-01-19 MED ORDER — SCOPOLAMINE 1 MG/3DAYS TD PT72
1.0000 | MEDICATED_PATCH | Freq: Once | TRANSDERMAL | Status: DC
Start: 2016-01-19 — End: 2016-01-19
  Administered 2016-01-19: 1.5 mg via TRANSDERMAL

## 2016-01-19 MED ORDER — CHLOROPROCAINE HCL 1 % IJ SOLN
INTRAMUSCULAR | Status: DC | PRN
  Administered 2016-01-19: 10 mL

## 2016-01-19 SURGICAL SUPPLY — 15 items
CATH ROBINSON RED A/P 16FR (CATHETERS) ×3 IMPLANT
CONTAINER PREFILL 10% NBF 60ML (FORM) ×6 IMPLANT
ELECTRODE RT ANGLE VERSAPOINT (CUTTING LOOP) IMPLANT
GLOVE BIOGEL PI IND STRL 6.5 (GLOVE) ×1 IMPLANT
GLOVE BIOGEL PI IND STRL 7.0 (GLOVE) ×1 IMPLANT
GLOVE BIOGEL PI INDICATOR 6.5 (GLOVE) ×2
GLOVE BIOGEL PI INDICATOR 7.0 (GLOVE) ×2
GLOVE SURG SS PI 6.0 STRL IVOR (GLOVE) ×3 IMPLANT
GOWN STRL REUS W/TWL LRG LVL3 (GOWN DISPOSABLE) ×6 IMPLANT
PACK VAGINAL MINOR WOMEN LF (CUSTOM PROCEDURE TRAY) ×3 IMPLANT
PAD OB MATERNITY 4.3X12.25 (PERSONAL CARE ITEMS) ×3 IMPLANT
TOWEL OR 17X24 6PK STRL BLUE (TOWEL DISPOSABLE) ×6 IMPLANT
TUBING AQUILEX INFLOW (TUBING) ×3 IMPLANT
TUBING AQUILEX OUTFLOW (TUBING) ×3 IMPLANT
WATER STERILE IRR 1000ML POUR (IV SOLUTION) ×3 IMPLANT

## 2016-01-19 NOTE — Op Note (Signed)
PREOPERATIVE DIAGNOSIS:  Abnormal uterine bleeding. POSTOPERATIVE DIAGNOSIS: The same PROCEDURE: Hysteroscopy, Dilation and Curettage. SURGEON:  Dr. Mora Bellman   INDICATIONS: 35 y.o. G0P0000  here for scheduled surgery for DUB.   Risks of surgery were discussed with the patient including but not limited to: bleeding which may require transfusion; infection which may require antibiotics; injury to uterus or surrounding organs; intrauterine scarring which may impair future fertility; need for additional procedures including laparotomy or laparoscopy; and other postoperative/anesthesia complications. Written informed consent was obtained.    FINDINGS:  A 8 week size uterus.  Diffuse proliferative endometrium.  Normal ostia bilaterally.  ANESTHESIA:   General, paracervical block. INTRAVENOUS FLUIDS:  1100 ml of LR FLUID DEFICITS:  185 ml of saline ESTIMATED BLOOD LOSS:  Less than 20 ml SPECIMENS: Endometrial curettings sent to pathology COMPLICATIONS:  None immediate.  PROCEDURE DETAILS:  The patient received intravenous antibiotics while in the preoperative area.  She was then taken to the operating room where general anesthesia was administered and was found to be adequate.  After an adequate timeout was performed, she was placed in the dorsal lithotomy position and examined; then prepped and draped in the sterile manner.   Her bladder was catheterized for an unmeasured amount of clear, yellow urine. A speculum was then placed in the patient's vagina and a single tooth tenaculum was applied to the anterior lip of the cervix.   A paracervical block using 10 ml of 0.5% Marcaine was administered.  The cervix was sounded to 8 cm and dilated manually with Hagar dilators to accommodate the 5 mm diagnostic hysteroscope.  Once the cervix was dilated, the hysteroscope was inserted under direct visualization using saline as a suspension medium.  The uterine cavity was carefully examined, both ostia were  recognized, and diffusely proliferative endometrium was noted.   After further careful visualization of the uterine cavity, the hysteroscope was removed under direct visualization.  A sharp curettage was then performed to obtain a moderate amount of endometrial curettings.  The tenaculum was removed from the anterior lip of the cervix and the vaginal speculum was removed after noting good hemostasis.  The patient tolerated the procedure well and was taken to the recovery area awake, extubated and in stable condition.

## 2016-01-19 NOTE — Discharge Instructions (Signed)
Hysteroscopy, Care After °Refer to this sheet in the next few weeks. These instructions provide you with information on caring for yourself after your procedure. Your health care provider may also give you more specific instructions. Your treatment has been planned according to current medical practices, but problems sometimes occur. Call your health care provider if you have any problems or questions after your procedure.  °WHAT TO EXPECT AFTER THE PROCEDURE °After your procedure, it is typical to have the following: °· You may have some cramping. This normally lasts for a couple days. °· You may have bleeding. This can vary from light spotting for a few days to menstrual-like bleeding for 3-7 days. °HOME CARE INSTRUCTIONS °· Rest for the first 1-2 days after the procedure. °· Only take over-the-counter or prescription medicines as directed by your health care provider. Do not take aspirin. It can increase the chances of bleeding. °· Take showers instead of baths for 2 weeks or as directed by your health care provider. °· Do not drive for 24 hours or as directed. °· Do not drink alcohol while taking pain medicine. °· Do not use tampons, douche, or have sexual intercourse for 2 weeks or until your health care provider says it is okay. °· Take your temperature twice a day for 4-5 days. Write it down each time. °· Follow your health care provider's advice about diet, exercise, and lifting. °· If you develop constipation, you may: °¨ Take a mild laxative if your health care provider approves. °¨ Add bran foods to your diet. °¨ Drink enough fluids to keep your urine clear or pale yellow. °· Try to have someone with you or available to you for the first 24-48 hours, especially if you were given a general anesthetic. °· Follow up with your health care provider as directed. °SEEK MEDICAL CARE IF: °· You feel dizzy or lightheaded. °· You feel sick to your stomach (nauseous). °· You have abnormal vaginal discharge. °· You  have a rash. °· You have pain that is not controlled with medicine. °SEEK IMMEDIATE MEDICAL CARE IF: °· You have bleeding that is heavier than a normal menstrual period. °· You have a fever. °· You have increasing cramps or pain, not controlled with medicine. °· You have new belly (abdominal) pain. °· You pass out. °· You have pain in the tops of your shoulders (shoulder strap areas). °· You have shortness of breath. °  °This information is not intended to replace advice given to you by your health care provider. Make sure you discuss any questions you have with your health care provider. °  °Document Released: 08/21/2013 Document Reviewed: 08/21/2013 °[employer]vier Interactive Patient Education ©2016 [employer] ° ° °Post Anesthesia Home Care Instructions ° °Activity: °Get plenty of rest for the remainder of the day. A responsible adult should stay with you for 24 hours following the procedure.  °For the next 24 hours, DO NOT: °-Drive a car °-Operate machinery °-Drink alcoholic beverages °-Take any medication unless instructed by your physician °-Make any legal decisions or sign important papers. ° °Meals: °Start with liquid foods such as gelatin or soup. Progress to regular foods as tolerated. Avoid greasy, spicy, heavy foods. If nausea and/or vomiting occur, drink only clear liquids until the nausea and/or vomiting subsides. Call your physician if vomiting continues. ° °Special Instructions/Symptoms: °Your throat may feel dry or sore from the anesthesia or the breathing tube placed in your throat during surgery. If this causes discomfort, gargle with warm salt water. The   discomfort should disappear within 24 hours.  If you had a scopolamine patch placed behind your ear for the management of post- operative nausea and/or vomiting:  1. The medication in the patch is effective for 72 hours, after which it should be removed.  Wrap patch in a tissue and discard in the trash. Wash hands thoroughly with soap and  water. 2. You may remove the patch earlier than 72 hours if you experience unpleasant side effects which may include dry mouth, dizziness or visual disturbances. 3. Avoid touching the patch. Wash your hands with soap and water after contact with the patch.   DISCHARGE INSTRUCTIONS: D&C / D&E The following instructions have been prepared to help you care for yourself upon your return home.   Personal hygiene:  Use sanitary pads for vaginal drainage, not tampons.  Shower the day after your procedure.  NO tub baths, pools or Jacuzzis for 2-3 weeks.  Wipe front to back after using the bathroom.  Activity and limitations:  Do NOT drive or operate any equipment for 24 hours. The effects of anesthesia are still present and drowsiness may result.  Do NOT rest in bed all day.  Walking is encouraged.  Walk up and down stairs slowly.  You may resume your normal activity in one to two days or as indicated by your physician.  Sexual activity: NO intercourse for at least 2 weeks after the procedure, or as indicated by your physician.  Diet: Eat a light meal as desired this evening. You may resume your usual diet tomorrow.  Return to work: You may resume your work activities in one to two days or as indicated by your doctor.  What to expect after your surgery: Expect to have vaginal bleeding/discharge for 2-3 days and spotting for up to 10 days. It is not unusual to have soreness for up to 1-2 weeks. You may have a slight burning sensation when you urinate for the first day. Mild cramps may continue for a couple of days. You may have a regular period in 2-6 weeks.  Call your doctor for any of the following:  Excessive vaginal bleeding, saturating and changing one pad every hour.  Inability to urinate 6 hours after discharge from hospital.  Pain not relieved by pain medication.  Fever of 100.4 F or greater.  Unusual vaginal discharge or odor.   Call for an appointment:     Patients signature: ______________________  Nurses signature ________________________  Support person's signature_______________________

## 2016-01-19 NOTE — Transfer of Care (Signed)
Immediate Anesthesia Transfer of Care Note  Patient: Sharon Jensen  Procedure(s) Performed: Procedure(s): DILATATION AND CURETTAGE /HYSTEROSCOPY (N/A)  Patient Location: PACU  Anesthesia Type:General  Level of Consciousness: awake  Airway & Oxygen Therapy: Patient Spontanous Breathing  Post-op Assessment: Report given to PACU RN  Post vital signs: stable  Filed Vitals:   01/19/16 0927  BP: 127/93  Pulse: 73  Temp: 37.1 C  Resp: 18    Complications: No apparent anesthesia complications

## 2016-01-19 NOTE — Anesthesia Postprocedure Evaluation (Signed)
Anesthesia Post Note  Patient: Sharon Jensen  Procedure(s) Performed: Procedure(s) (LRB): DILATATION AND CURETTAGE /HYSTEROSCOPY (N/A)  Patient location during evaluation: PACU Anesthesia Type: General Level of consciousness: awake and alert Pain management: pain level controlled (Prior to discharge, reports pain is much less intense and a 0/10) Vital Signs Assessment: post-procedure vital signs reviewed and stable Respiratory status: spontaneous breathing, nonlabored ventilation, respiratory function stable and patient connected to nasal cannula oxygen Cardiovascular status: blood pressure returned to baseline and stable Postop Assessment: no signs of nausea or vomiting Anesthetic complications: no    Last Vitals:  Filed Vitals:   01/19/16 1345 01/19/16 1400  BP: 126/78 133/84  Pulse: 64 76  Temp:  37 C  Resp: 20 18    Last Pain:  Filed Vitals:   01/19/16 1409  PainSc: 0-No pain                 Alexarae Oliva JENNETTE

## 2016-01-19 NOTE — Anesthesia Preprocedure Evaluation (Signed)
Anesthesia Evaluation  Patient identified by MRN, date of birth, ID band Patient awake    Reviewed: Allergy & Precautions, H&P , Patient's Chart, lab work & pertinent test results, reviewed documented beta blocker date and time   Airway Mallampati: II  TM Distance: >3 FB Neck ROM: full    Dental no notable dental hx.    Pulmonary asthma ,    Pulmonary exam normal breath sounds clear to auscultation       Cardiovascular  Rhythm:regular Rate:Normal     Neuro/Psych    GI/Hepatic GERD  ,  Endo/Other    Renal/GU      Musculoskeletal   Abdominal   Peds  Hematology   Anesthesia Other Findings   Reproductive/Obstetrics                             Anesthesia Physical Anesthesia Plan  ASA: II  Anesthesia Plan:    Post-op Pain Management:    Induction: Intravenous  Airway Management Planned: LMA  Additional Equipment:   Intra-op Plan:   Post-operative Plan:   Informed Consent: I have reviewed the patients History and Physical, chart, labs and discussed the procedure including the risks, benefits and alternatives for the proposed anesthesia with the patient or authorized representative who has indicated his/her understanding and acceptance.   Dental Advisory Given and Dental advisory given  Plan Discussed with: CRNA and Surgeon  Anesthesia Plan Comments: (Discussed GA with LMA, possible sore throat, potential need to switch to ETT, N/V, pulmonary aspiration. Questions answered. )        Anesthesia Quick Evaluation

## 2016-01-19 NOTE — Anesthesia Procedure Notes (Signed)
Procedure Name: LMA Insertion Date/Time: 01/19/2016 10:50 AM Performed by: Georgeanne Nim Pre-anesthesia Checklist: Patient identified, Patient being monitored, Timeout performed, Emergency Drugs available and Suction available Patient Re-evaluated:Patient Re-evaluated prior to inductionOxygen Delivery Method: Circle system utilized Preoxygenation: Pre-oxygenation with 100% oxygen Intubation Type: IV induction Ventilation: Mask ventilation without difficulty LMA: LMA inserted and LMA with gastric port inserted LMA Size: 4.0 Number of attempts: 1 Placement Confirmation: positive ETCO2,  CO2 detector and breath sounds checked- equal and bilateral Tube secured with: Tape Dental Injury: Teeth and Oropharynx as per pre-operative assessment

## 2016-01-20 ENCOUNTER — Encounter (HOSPITAL_COMMUNITY): Payer: Self-pay | Admitting: Obstetrics and Gynecology

## 2016-02-08 ENCOUNTER — Ambulatory Visit: Payer: BLUE CROSS/BLUE SHIELD | Admitting: Obstetrics and Gynecology

## 2016-02-19 ENCOUNTER — Ambulatory Visit (INDEPENDENT_AMBULATORY_CARE_PROVIDER_SITE_OTHER): Payer: BLUE CROSS/BLUE SHIELD | Admitting: Obstetrics and Gynecology

## 2016-02-19 ENCOUNTER — Encounter: Payer: Self-pay | Admitting: Obstetrics and Gynecology

## 2016-02-19 VITALS — BP 140/94 | HR 64 | Temp 98.8°F | Wt 267.3 lb

## 2016-02-19 DIAGNOSIS — Z9889 Other specified postprocedural states: Secondary | ICD-10-CM

## 2016-02-19 NOTE — Progress Notes (Signed)
Patient ID: Sharon Jensen, female   DOB: 01-02-1981, 35 y.o.   MRN: [id number] 35 yo G0 presenting today for post op check. Patient had a D&C on 01/17/2016 secondary to DUB. She reports feeling well since the surgery. She describes a much lighter period than what she has been used to. She is still experiencing some cramps but they are managed with ibuprofen  Past Medical History  Diagnosis Date  . Asthma   . History of blood transfusion   . Anemia   . Hypothyroidism     slight low, taking vitamins, will followup  . Depression   . GERD (gastroesophageal reflux disease)    Past Surgical History  Procedure Laterality Date  . Upper gi endoscopy    . Hysteroscopy w/d&c N/A 01/19/2016    Procedure: DILATATION AND CURETTAGE /HYSTEROSCOPY;  Surgeon: Mora Bellman, MD;  Location: [facility];  Service: Gynecology;  Laterality: N/A;   No family history on file. Social History  Substance Use Topics  . Smoking status: Never Smoker   . Smokeless tobacco: Never Used  . Alcohol Use: Yes     Comment: rare   ROS See pertinent in HPI Blood pressure 140/94, pulse 64, temperature 98.8 F (37.1 C), temperature source Oral, weight 267 lb 4.8 oz (121.246 kg), last menstrual period 01/21/2016.  GENERAL: Well-developed, well-nourished female in no acute distress.  ABDOMEN: Soft, nontender, nondistended. Obese PELVIC: Normal external female genitalia. Vagina is pink and rugated.  Normal discharge. Normal appearing cervix. Uterus is normal in size. No adnexal mass or tenderness. EXTREMITIES: No cyanosis, clubbing, or edema, 2+ distal pulses.  A/P 35 yo here for post op check - Patient is doing well and is medically cleared to resume all activities of daily living - Patient plans to continue OCP that she has - RTC in July/August for follow up with DUB - Informed patient of contraindication of OCP and HTN. She denies any h/o HTN and will monitor her BP at home. Patient agrees to return for a change in birth control  if BP remains elevated

## 2016-03-24 DIAGNOSIS — J4520 Mild intermittent asthma, uncomplicated: Secondary | ICD-10-CM | POA: Diagnosis not present

## 2016-03-24 DIAGNOSIS — J45901 Unspecified asthma with (acute) exacerbation: Secondary | ICD-10-CM | POA: Diagnosis not present

## 2016-03-25 ENCOUNTER — Other Ambulatory Visit (HOSPITAL_COMMUNITY): Payer: Self-pay | Admitting: Respiratory Therapy

## 2016-03-25 DIAGNOSIS — J45909 Unspecified asthma, uncomplicated: Secondary | ICD-10-CM

## 2016-06-03 DIAGNOSIS — B9689 Other specified bacterial agents as the cause of diseases classified elsewhere: Secondary | ICD-10-CM | POA: Diagnosis not present

## 2016-06-03 DIAGNOSIS — B373 Candidiasis of vulva and vagina: Secondary | ICD-10-CM | POA: Diagnosis not present

## 2016-06-03 DIAGNOSIS — N760 Acute vaginitis: Secondary | ICD-10-CM | POA: Diagnosis not present

## 2016-07-13 DIAGNOSIS — Z23 Encounter for immunization: Secondary | ICD-10-CM | POA: Diagnosis not present

## 2016-08-07 DIAGNOSIS — J019 Acute sinusitis, unspecified: Secondary | ICD-10-CM | POA: Diagnosis not present

## 2016-08-17 DIAGNOSIS — J019 Acute sinusitis, unspecified: Secondary | ICD-10-CM | POA: Diagnosis not present

## 2016-11-23 DIAGNOSIS — F4323 Adjustment disorder with mixed anxiety and depressed mood: Secondary | ICD-10-CM | POA: Diagnosis not present

## 2016-11-24 DIAGNOSIS — F329 Major depressive disorder, single episode, unspecified: Secondary | ICD-10-CM | POA: Diagnosis not present

## 2016-12-07 DIAGNOSIS — F339 Major depressive disorder, recurrent, unspecified: Secondary | ICD-10-CM | POA: Diagnosis not present

## 2016-12-08 DIAGNOSIS — F4323 Adjustment disorder with mixed anxiety and depressed mood: Secondary | ICD-10-CM | POA: Diagnosis not present

## 2016-12-13 DIAGNOSIS — F339 Major depressive disorder, recurrent, unspecified: Secondary | ICD-10-CM | POA: Diagnosis not present

## 2016-12-21 DIAGNOSIS — F339 Major depressive disorder, recurrent, unspecified: Secondary | ICD-10-CM | POA: Diagnosis not present

## 2016-12-28 DIAGNOSIS — F339 Major depressive disorder, recurrent, unspecified: Secondary | ICD-10-CM | POA: Diagnosis not present

## 2017-01-04 DIAGNOSIS — F339 Major depressive disorder, recurrent, unspecified: Secondary | ICD-10-CM | POA: Diagnosis not present

## 2017-01-11 DIAGNOSIS — F339 Major depressive disorder, recurrent, unspecified: Secondary | ICD-10-CM | POA: Diagnosis not present

## 2017-01-13 DIAGNOSIS — B9689 Other specified bacterial agents as the cause of diseases classified elsewhere: Secondary | ICD-10-CM | POA: Diagnosis not present

## 2017-01-13 DIAGNOSIS — R300 Dysuria: Secondary | ICD-10-CM | POA: Diagnosis not present

## 2017-01-13 DIAGNOSIS — N760 Acute vaginitis: Secondary | ICD-10-CM | POA: Diagnosis not present

## 2017-01-18 DIAGNOSIS — F339 Major depressive disorder, recurrent, unspecified: Secondary | ICD-10-CM | POA: Diagnosis not present

## 2017-01-20 DIAGNOSIS — F329 Major depressive disorder, single episode, unspecified: Secondary | ICD-10-CM | POA: Diagnosis not present

## 2017-01-20 DIAGNOSIS — R21 Rash and other nonspecific skin eruption: Secondary | ICD-10-CM | POA: Diagnosis not present

## 2017-02-07 DIAGNOSIS — F339 Major depressive disorder, recurrent, unspecified: Secondary | ICD-10-CM | POA: Diagnosis not present

## 2017-02-21 DIAGNOSIS — J45901 Unspecified asthma with (acute) exacerbation: Secondary | ICD-10-CM | POA: Diagnosis not present

## 2017-03-01 DIAGNOSIS — F339 Major depressive disorder, recurrent, unspecified: Secondary | ICD-10-CM | POA: Diagnosis not present

## 2017-03-06 IMAGING — US US PELVIS COMPLETE
1 series · 15 of 25 positions shown · non-contrast
Comparison: 01/17/2011

CLINICAL DATA: Dysmenorrhea and irregular menses for 1 year.
Fibroids. Anemia.

EXAM:
TRANSABDOMINAL AND TRANSVAGINAL ULTRASOUND OF PELVIS
TECHNIQUE: Both transabdominal and transvaginal ultrasound examinations of the
pelvis were performed. Transabdominal technique was performed for
global imaging of the pelvis including uterus, ovaries, adnexal
regions, and pelvic cul-de-sac. It was necessary to proceed with
endovaginal exam following the transabdominal exam to visualize the
small fibroids and ovaries.

[Series 1: us pelvis complete · 15 of 50 slices shown]
[im 1/50]
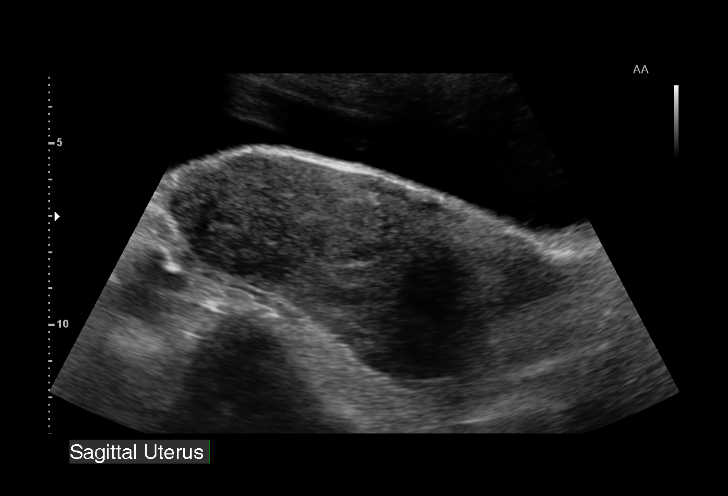
[im 5/50]
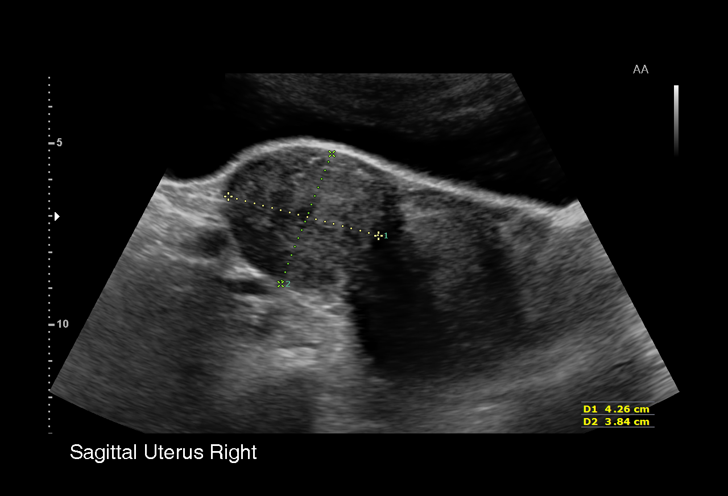
[im 9/50]
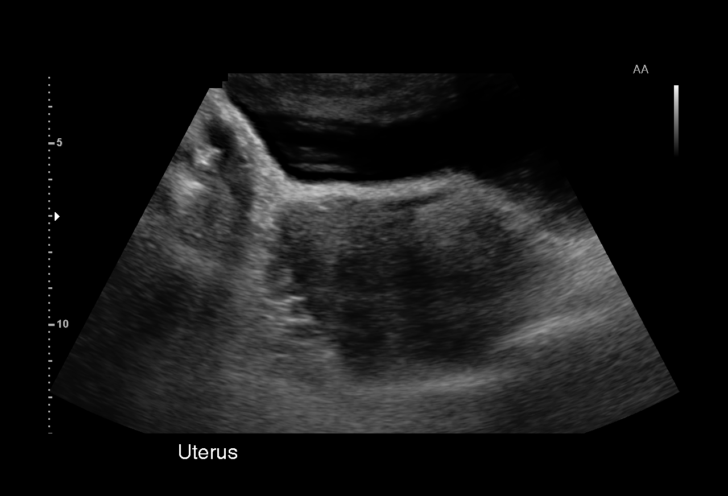
[im 11/50]
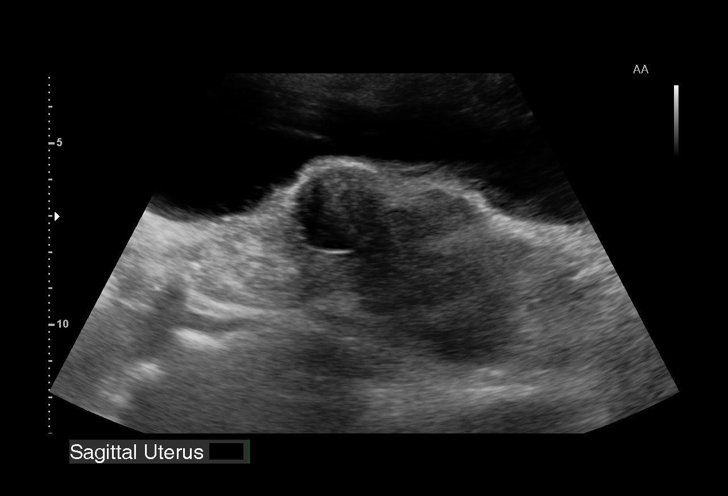
[im 15/50]
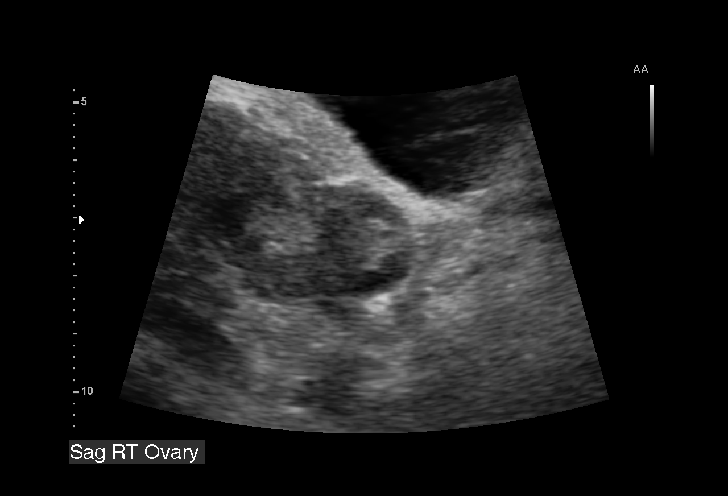
[im 19/50]
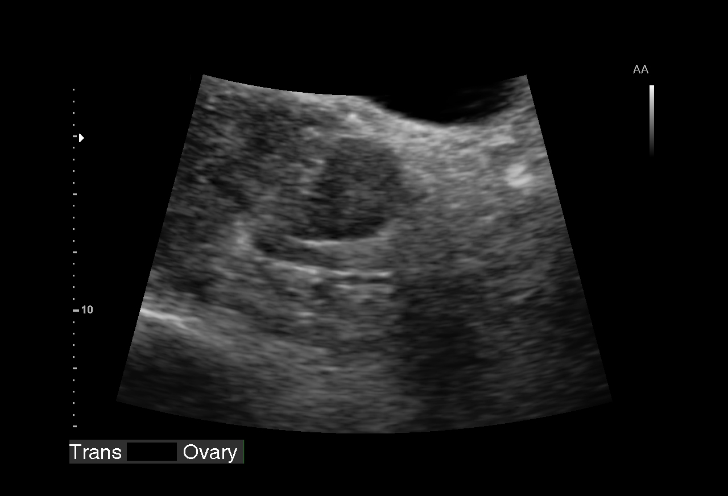
[im 21/50]
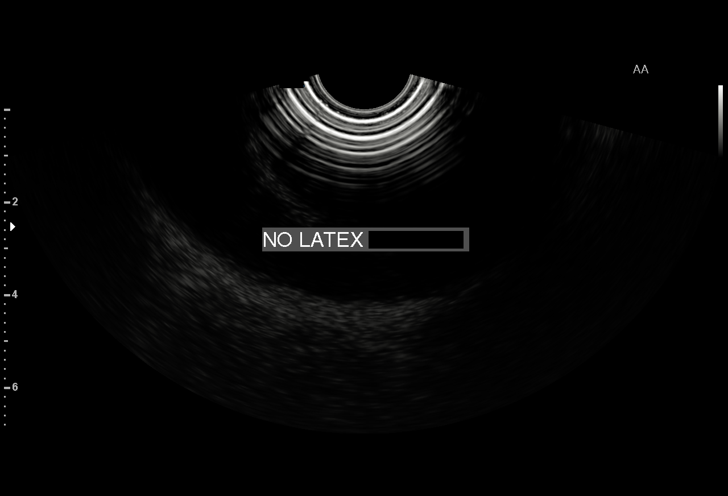
[im 25/50]
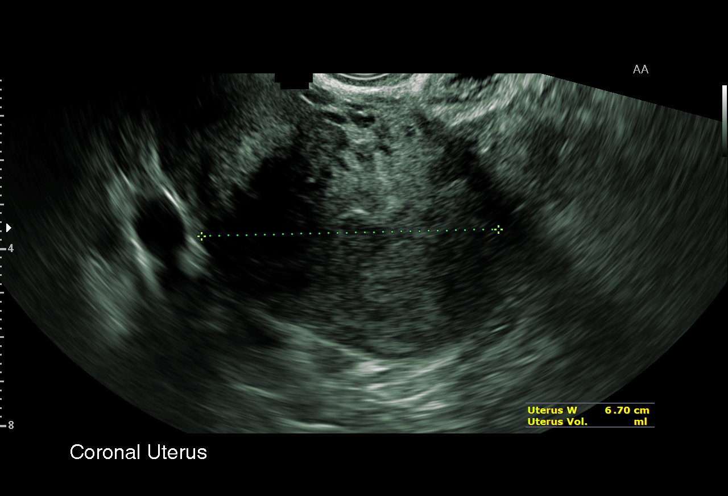
[im 29/50]
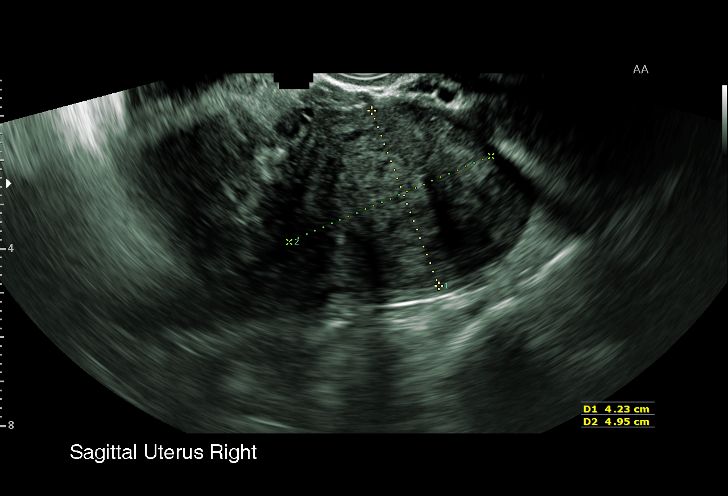
[im 31/50]
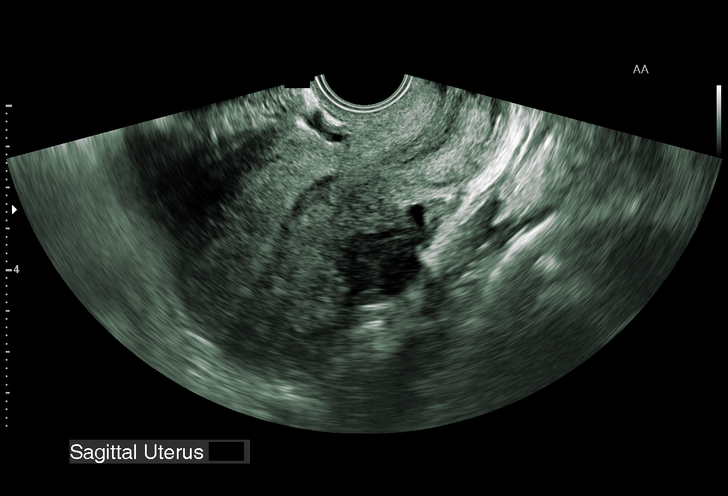
[im 35/50]
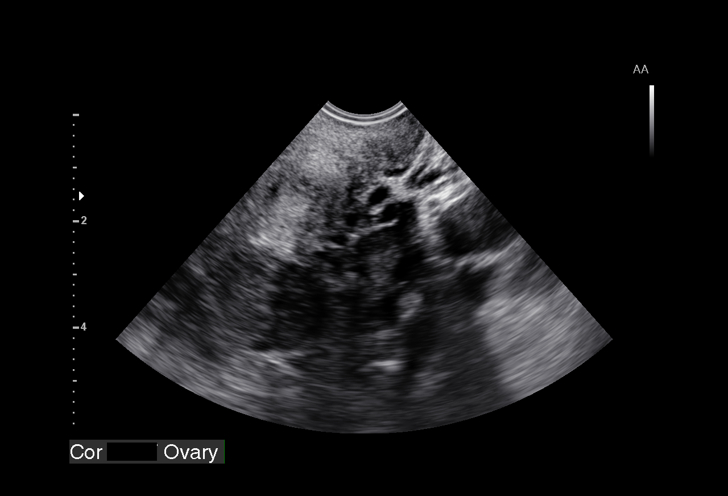
[im 39/50]
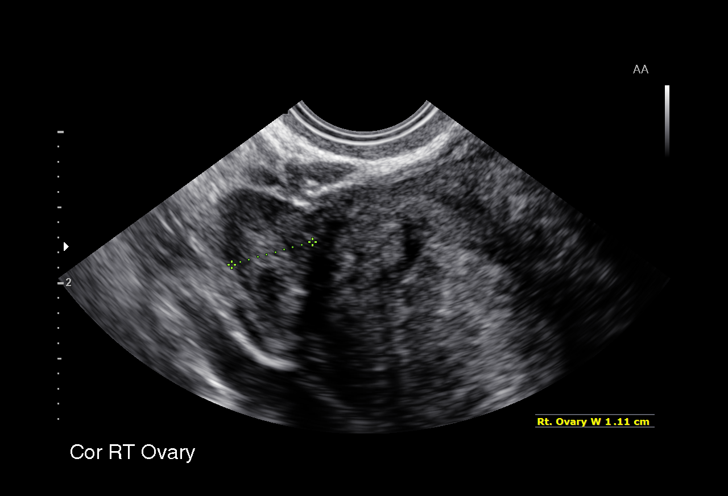
[im 41/50]
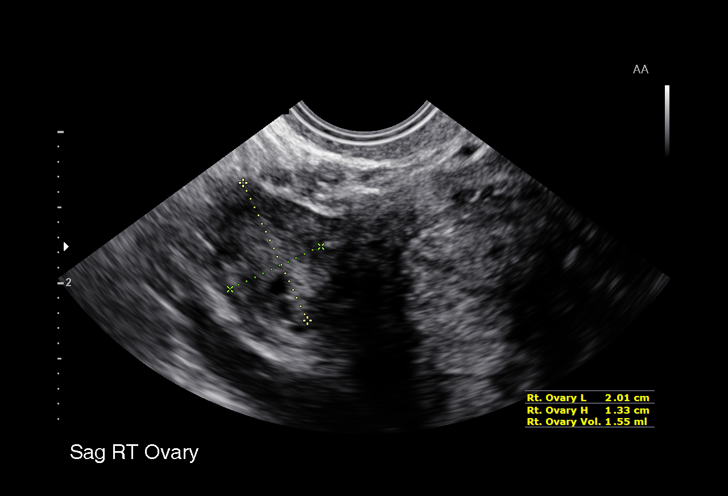
[im 45/50]
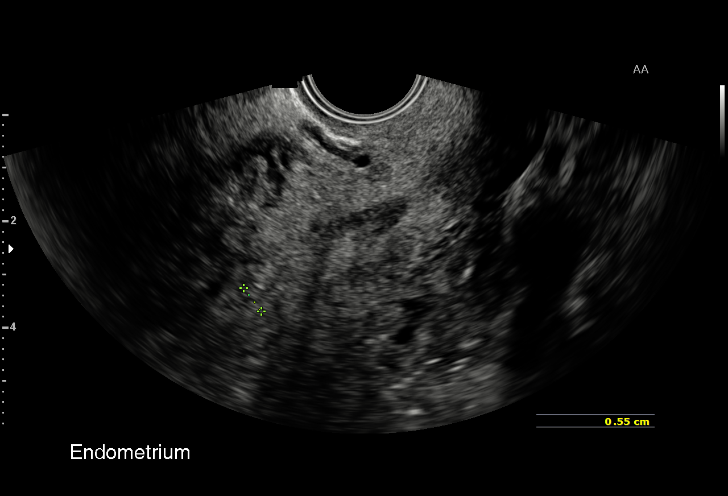
[im 50/50]
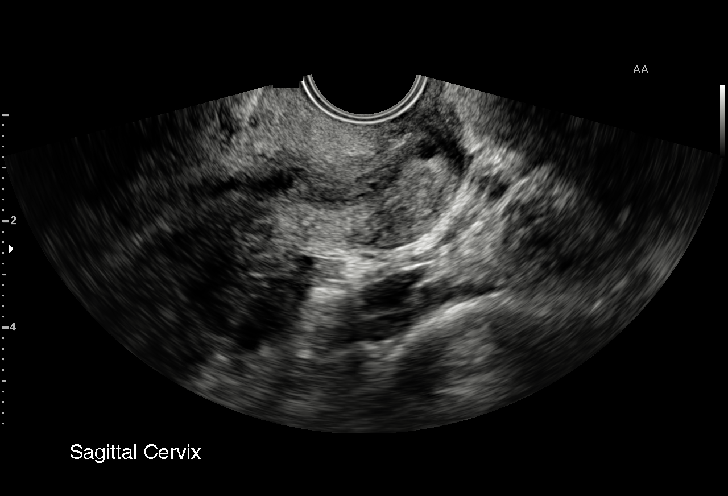

[15 of 25 positions shown; findings below may reference images not displayed]

FINDINGS: Uterus

Measurements: 8.6 x 4.2 x 6.7 cm. Two adjacent subserosal fibroids
are seen in the left fundal region measuring 4.3 cm and 2.4 cm in
maximum diameter. A smaller subserosal fibroid is seen in the
posterior corpus measuring 2.0 cm in maximum diameter.

Endometrium

Thickness: 6 mm.  No focal abnormality visualized.

Right ovary

Measurements: 2.0 x 1.3 x 1.1 cm. Normal appearance/no adnexal mass.

Left ovary

Measurements: 1.9 x 1.1 x 1.0 cm. Normal appearance/no adnexal mass.

Other findings

No abnormal free fluid.
IMPRESSION: Several small uterine fibroids, as described above.

Normal appearance of both ovaries.  No adnexal mass identified.

## 2017-03-22 DIAGNOSIS — F339 Major depressive disorder, recurrent, unspecified: Secondary | ICD-10-CM | POA: Diagnosis not present

## 2017-04-19 DIAGNOSIS — F339 Major depressive disorder, recurrent, unspecified: Secondary | ICD-10-CM | POA: Diagnosis not present

## 2017-08-01 DIAGNOSIS — J45901 Unspecified asthma with (acute) exacerbation: Secondary | ICD-10-CM | POA: Diagnosis not present

## 2017-08-01 DIAGNOSIS — J019 Acute sinusitis, unspecified: Secondary | ICD-10-CM | POA: Diagnosis not present

## 2018-07-03 DIAGNOSIS — N898 Other specified noninflammatory disorders of vagina: Secondary | ICD-10-CM | POA: Diagnosis not present

## 2018-07-03 DIAGNOSIS — B373 Candidiasis of vulva and vagina: Secondary | ICD-10-CM | POA: Diagnosis not present

## 2018-08-02 DIAGNOSIS — N809 Endometriosis, unspecified: Secondary | ICD-10-CM | POA: Diagnosis not present

## 2018-08-02 DIAGNOSIS — N760 Acute vaginitis: Secondary | ICD-10-CM | POA: Diagnosis not present

## 2018-08-02 DIAGNOSIS — N946 Dysmenorrhea, unspecified: Secondary | ICD-10-CM | POA: Diagnosis not present

## 2018-08-03 DIAGNOSIS — N939 Abnormal uterine and vaginal bleeding, unspecified: Secondary | ICD-10-CM | POA: Diagnosis not present

## 2018-08-03 DIAGNOSIS — Z3201 Encounter for pregnancy test, result positive: Secondary | ICD-10-CM | POA: Diagnosis not present

## 2018-08-03 DIAGNOSIS — D251 Intramural leiomyoma of uterus: Secondary | ICD-10-CM | POA: Diagnosis not present

## 2018-08-03 DIAGNOSIS — O208 Other hemorrhage in early pregnancy: Secondary | ICD-10-CM | POA: Diagnosis not present

## 2018-08-03 DIAGNOSIS — R102 Pelvic and perineal pain: Secondary | ICD-10-CM | POA: Diagnosis not present

## 2018-08-03 DIAGNOSIS — O034 Incomplete spontaneous abortion without complication: Secondary | ICD-10-CM | POA: Diagnosis not present

## 2018-08-03 DIAGNOSIS — D252 Subserosal leiomyoma of uterus: Secondary | ICD-10-CM | POA: Diagnosis not present

## 2018-08-03 DIAGNOSIS — Z3A00 Weeks of gestation of pregnancy not specified: Secondary | ICD-10-CM | POA: Diagnosis not present

## 2018-08-06 DIAGNOSIS — D259 Leiomyoma of uterus, unspecified: Secondary | ICD-10-CM | POA: Diagnosis not present

## 2018-08-06 DIAGNOSIS — Z3A01 Less than 8 weeks gestation of pregnancy: Secondary | ICD-10-CM | POA: Diagnosis not present

## 2018-08-06 DIAGNOSIS — Z3A12 12 weeks gestation of pregnancy: Secondary | ICD-10-CM | POA: Diagnosis not present

## 2018-08-06 DIAGNOSIS — O021 Missed abortion: Secondary | ICD-10-CM | POA: Diagnosis not present

## 2018-08-06 DIAGNOSIS — D649 Anemia, unspecified: Secondary | ICD-10-CM | POA: Diagnosis not present

## 2018-08-06 DIAGNOSIS — J45909 Unspecified asthma, uncomplicated: Secondary | ICD-10-CM | POA: Diagnosis not present

## 2018-08-06 DIAGNOSIS — O034 Incomplete spontaneous abortion without complication: Secondary | ICD-10-CM | POA: Diagnosis not present

## 2018-08-06 DIAGNOSIS — N72 Inflammatory disease of cervix uteri: Secondary | ICD-10-CM | POA: Diagnosis not present

## 2018-08-15 DIAGNOSIS — N939 Abnormal uterine and vaginal bleeding, unspecified: Secondary | ICD-10-CM | POA: Diagnosis not present

## 2018-08-15 DIAGNOSIS — N9489 Other specified conditions associated with female genital organs and menstrual cycle: Secondary | ICD-10-CM | POA: Diagnosis not present

## 2018-08-15 DIAGNOSIS — O219 Vomiting of pregnancy, unspecified: Secondary | ICD-10-CM | POA: Diagnosis not present

## 2018-08-15 DIAGNOSIS — Z3A00 Weeks of gestation of pregnancy not specified: Secondary | ICD-10-CM | POA: Diagnosis not present

## 2018-08-15 DIAGNOSIS — R109 Unspecified abdominal pain: Secondary | ICD-10-CM | POA: Diagnosis not present

## 2018-08-15 DIAGNOSIS — Z79899 Other long term (current) drug therapy: Secondary | ICD-10-CM | POA: Diagnosis not present

## 2018-08-15 DIAGNOSIS — Z793 Long term (current) use of hormonal contraceptives: Secondary | ICD-10-CM | POA: Diagnosis not present

## 2018-08-15 DIAGNOSIS — O3680X0 Pregnancy with inconclusive fetal viability, not applicable or unspecified: Secondary | ICD-10-CM | POA: Diagnosis not present

## 2018-08-15 DIAGNOSIS — O208 Other hemorrhage in early pregnancy: Secondary | ICD-10-CM | POA: Diagnosis not present

## 2018-08-15 DIAGNOSIS — R197 Diarrhea, unspecified: Secondary | ICD-10-CM | POA: Diagnosis not present

## 2018-08-15 DIAGNOSIS — O009 Unspecified ectopic pregnancy without intrauterine pregnancy: Secondary | ICD-10-CM | POA: Diagnosis not present

## 2018-08-15 DIAGNOSIS — O209 Hemorrhage in early pregnancy, unspecified: Secondary | ICD-10-CM | POA: Diagnosis not present

## 2018-08-15 DIAGNOSIS — R1032 Left lower quadrant pain: Secondary | ICD-10-CM | POA: Diagnosis not present

## 2018-08-15 DIAGNOSIS — J45909 Unspecified asthma, uncomplicated: Secondary | ICD-10-CM | POA: Diagnosis not present

## 2018-08-15 DIAGNOSIS — D259 Leiomyoma of uterus, unspecified: Secondary | ICD-10-CM | POA: Diagnosis not present

## 2018-08-15 DIAGNOSIS — N800 Endometriosis of uterus: Secondary | ICD-10-CM | POA: Diagnosis not present

## 2018-08-28 DIAGNOSIS — O3680X0 Pregnancy with inconclusive fetal viability, not applicable or unspecified: Secondary | ICD-10-CM | POA: Diagnosis not present

## 2018-08-28 DIAGNOSIS — Z3A00 Weeks of gestation of pregnancy not specified: Secondary | ICD-10-CM | POA: Diagnosis not present

## 2018-09-04 DIAGNOSIS — O3680X0 Pregnancy with inconclusive fetal viability, not applicable or unspecified: Secondary | ICD-10-CM | POA: Diagnosis not present

## 2018-09-04 DIAGNOSIS — Z3A00 Weeks of gestation of pregnancy not specified: Secondary | ICD-10-CM | POA: Diagnosis not present

## 2018-09-11 DIAGNOSIS — O3680X0 Pregnancy with inconclusive fetal viability, not applicable or unspecified: Secondary | ICD-10-CM | POA: Diagnosis not present

## 2018-09-11 DIAGNOSIS — Z3A00 Weeks of gestation of pregnancy not specified: Secondary | ICD-10-CM | POA: Diagnosis not present

## 2018-09-18 DIAGNOSIS — O3680X0 Pregnancy with inconclusive fetal viability, not applicable or unspecified: Secondary | ICD-10-CM | POA: Diagnosis not present

## 2018-09-18 DIAGNOSIS — Z3A00 Weeks of gestation of pregnancy not specified: Secondary | ICD-10-CM | POA: Diagnosis not present

## 2018-09-25 DIAGNOSIS — Z3A00 Weeks of gestation of pregnancy not specified: Secondary | ICD-10-CM | POA: Diagnosis not present

## 2018-09-25 DIAGNOSIS — O3680X0 Pregnancy with inconclusive fetal viability, not applicable or unspecified: Secondary | ICD-10-CM | POA: Diagnosis not present

## 2018-10-02 DIAGNOSIS — O3680X0 Pregnancy with inconclusive fetal viability, not applicable or unspecified: Secondary | ICD-10-CM | POA: Diagnosis not present

## 2018-10-02 DIAGNOSIS — Z3A00 Weeks of gestation of pregnancy not specified: Secondary | ICD-10-CM | POA: Diagnosis not present

## 2018-10-09 DIAGNOSIS — O3680X0 Pregnancy with inconclusive fetal viability, not applicable or unspecified: Secondary | ICD-10-CM | POA: Diagnosis not present

## 2018-10-09 DIAGNOSIS — Z3A00 Weeks of gestation of pregnancy not specified: Secondary | ICD-10-CM | POA: Diagnosis not present

## 2018-10-17 DIAGNOSIS — Z3A00 Weeks of gestation of pregnancy not specified: Secondary | ICD-10-CM | POA: Diagnosis not present

## 2018-10-17 DIAGNOSIS — O3680X0 Pregnancy with inconclusive fetal viability, not applicable or unspecified: Secondary | ICD-10-CM | POA: Diagnosis not present

## 2018-11-05 DIAGNOSIS — O3680X0 Pregnancy with inconclusive fetal viability, not applicable or unspecified: Secondary | ICD-10-CM | POA: Diagnosis not present

## 2018-11-13 DIAGNOSIS — Z3A00 Weeks of gestation of pregnancy not specified: Secondary | ICD-10-CM | POA: Diagnosis not present

## 2018-11-13 DIAGNOSIS — O3680X0 Pregnancy with inconclusive fetal viability, not applicable or unspecified: Secondary | ICD-10-CM | POA: Diagnosis not present

## 2018-11-20 DIAGNOSIS — O3680X0 Pregnancy with inconclusive fetal viability, not applicable or unspecified: Secondary | ICD-10-CM | POA: Diagnosis not present

## 2018-11-28 DIAGNOSIS — O3680X0 Pregnancy with inconclusive fetal viability, not applicable or unspecified: Secondary | ICD-10-CM | POA: Diagnosis not present

## 2018-12-05 DIAGNOSIS — O3680X0 Pregnancy with inconclusive fetal viability, not applicable or unspecified: Secondary | ICD-10-CM | POA: Diagnosis not present

## 2018-12-05 DIAGNOSIS — Z3A00 Weeks of gestation of pregnancy not specified: Secondary | ICD-10-CM | POA: Diagnosis not present

## 2019-02-12 DIAGNOSIS — F329 Major depressive disorder, single episode, unspecified: Secondary | ICD-10-CM | POA: Diagnosis not present

## 2019-04-10 DIAGNOSIS — F332 Major depressive disorder, recurrent severe without psychotic features: Secondary | ICD-10-CM | POA: Diagnosis not present

## 2019-04-10 DIAGNOSIS — F341 Dysthymic disorder: Secondary | ICD-10-CM | POA: Diagnosis not present

## 2019-04-15 DIAGNOSIS — F332 Major depressive disorder, recurrent severe without psychotic features: Secondary | ICD-10-CM | POA: Diagnosis not present

## 2019-04-15 DIAGNOSIS — F341 Dysthymic disorder: Secondary | ICD-10-CM | POA: Diagnosis not present

## 2019-04-17 DIAGNOSIS — F3340 Major depressive disorder, recurrent, in remission, unspecified: Secondary | ICD-10-CM | POA: Diagnosis not present

## 2019-04-17 DIAGNOSIS — F332 Major depressive disorder, recurrent severe without psychotic features: Secondary | ICD-10-CM | POA: Diagnosis not present

## 2019-04-17 DIAGNOSIS — F339 Major depressive disorder, recurrent, unspecified: Secondary | ICD-10-CM | POA: Diagnosis not present

## 2019-04-17 DIAGNOSIS — F341 Dysthymic disorder: Secondary | ICD-10-CM | POA: Diagnosis not present

## 2019-04-23 DIAGNOSIS — F341 Dysthymic disorder: Secondary | ICD-10-CM | POA: Diagnosis not present

## 2019-04-23 DIAGNOSIS — F332 Major depressive disorder, recurrent severe without psychotic features: Secondary | ICD-10-CM | POA: Diagnosis not present

## 2019-04-23 DIAGNOSIS — F339 Major depressive disorder, recurrent, unspecified: Secondary | ICD-10-CM | POA: Diagnosis not present

## 2019-04-29 DIAGNOSIS — F339 Major depressive disorder, recurrent, unspecified: Secondary | ICD-10-CM | POA: Diagnosis not present

## 2019-05-06 DIAGNOSIS — F339 Major depressive disorder, recurrent, unspecified: Secondary | ICD-10-CM | POA: Diagnosis not present

## 2019-05-14 DIAGNOSIS — F3340 Major depressive disorder, recurrent, in remission, unspecified: Secondary | ICD-10-CM | POA: Diagnosis not present

## 2019-05-20 DIAGNOSIS — F332 Major depressive disorder, recurrent severe without psychotic features: Secondary | ICD-10-CM | POA: Diagnosis not present

## 2019-05-20 DIAGNOSIS — F341 Dysthymic disorder: Secondary | ICD-10-CM | POA: Diagnosis not present

## 2019-05-27 DIAGNOSIS — F339 Major depressive disorder, recurrent, unspecified: Secondary | ICD-10-CM | POA: Diagnosis not present

## 2019-07-08 DIAGNOSIS — F339 Major depressive disorder, recurrent, unspecified: Secondary | ICD-10-CM | POA: Diagnosis not present

## 2019-08-06 DIAGNOSIS — F3340 Major depressive disorder, recurrent, in remission, unspecified: Secondary | ICD-10-CM | POA: Diagnosis not present

## 2019-12-03 DIAGNOSIS — F3340 Major depressive disorder, recurrent, in remission, unspecified: Secondary | ICD-10-CM | POA: Diagnosis not present

## 2020-01-09 DIAGNOSIS — F3340 Major depressive disorder, recurrent, in remission, unspecified: Secondary | ICD-10-CM | POA: Diagnosis not present

## 2020-01-10 DIAGNOSIS — F3340 Major depressive disorder, recurrent, in remission, unspecified: Secondary | ICD-10-CM | POA: Diagnosis not present

## 2020-01-14 DIAGNOSIS — F3340 Major depressive disorder, recurrent, in remission, unspecified: Secondary | ICD-10-CM | POA: Diagnosis not present

## 2020-01-16 DIAGNOSIS — F3340 Major depressive disorder, recurrent, in remission, unspecified: Secondary | ICD-10-CM | POA: Diagnosis not present

## 2020-01-16 DIAGNOSIS — F4321 Adjustment disorder with depressed mood: Secondary | ICD-10-CM | POA: Diagnosis not present

## 2020-01-21 DIAGNOSIS — F3340 Major depressive disorder, recurrent, in remission, unspecified: Secondary | ICD-10-CM | POA: Diagnosis not present

## 2020-01-23 DIAGNOSIS — F3340 Major depressive disorder, recurrent, in remission, unspecified: Secondary | ICD-10-CM | POA: Diagnosis not present

## 2020-01-28 DIAGNOSIS — F331 Major depressive disorder, recurrent, moderate: Secondary | ICD-10-CM | POA: Diagnosis not present

## 2020-02-04 DIAGNOSIS — Z23 Encounter for immunization: Secondary | ICD-10-CM | POA: Diagnosis not present

## 2020-02-04 DIAGNOSIS — F331 Major depressive disorder, recurrent, moderate: Secondary | ICD-10-CM | POA: Diagnosis not present

## 2020-02-06 DIAGNOSIS — F339 Major depressive disorder, recurrent, unspecified: Secondary | ICD-10-CM | POA: Diagnosis not present

## 2020-02-11 DIAGNOSIS — F331 Major depressive disorder, recurrent, moderate: Secondary | ICD-10-CM | POA: Diagnosis not present

## 2020-02-20 DIAGNOSIS — F331 Major depressive disorder, recurrent, moderate: Secondary | ICD-10-CM | POA: Diagnosis not present

## 2020-02-20 DIAGNOSIS — F4311 Post-traumatic stress disorder, acute: Secondary | ICD-10-CM | POA: Diagnosis not present

## 2020-02-27 DIAGNOSIS — F4311 Post-traumatic stress disorder, acute: Secondary | ICD-10-CM | POA: Diagnosis not present

## 2020-02-27 DIAGNOSIS — F331 Major depressive disorder, recurrent, moderate: Secondary | ICD-10-CM | POA: Diagnosis not present

## 2020-03-03 DIAGNOSIS — Z23 Encounter for immunization: Secondary | ICD-10-CM | POA: Diagnosis not present

## 2020-03-05 DIAGNOSIS — F331 Major depressive disorder, recurrent, moderate: Secondary | ICD-10-CM | POA: Diagnosis not present

## 2020-03-05 DIAGNOSIS — F4311 Post-traumatic stress disorder, acute: Secondary | ICD-10-CM | POA: Diagnosis not present

## 2020-03-05 DIAGNOSIS — F339 Major depressive disorder, recurrent, unspecified: Secondary | ICD-10-CM | POA: Diagnosis not present

## 2020-03-12 DIAGNOSIS — F4311 Post-traumatic stress disorder, acute: Secondary | ICD-10-CM | POA: Diagnosis not present

## 2020-03-12 DIAGNOSIS — F339 Major depressive disorder, recurrent, unspecified: Secondary | ICD-10-CM | POA: Diagnosis not present

## 2020-03-19 DIAGNOSIS — F339 Major depressive disorder, recurrent, unspecified: Secondary | ICD-10-CM | POA: Diagnosis not present

## 2020-03-19 DIAGNOSIS — F4311 Post-traumatic stress disorder, acute: Secondary | ICD-10-CM | POA: Diagnosis not present

## 2020-04-02 DIAGNOSIS — F339 Major depressive disorder, recurrent, unspecified: Secondary | ICD-10-CM | POA: Diagnosis not present

## 2020-04-03 DIAGNOSIS — F339 Major depressive disorder, recurrent, unspecified: Secondary | ICD-10-CM | POA: Diagnosis not present

## 2020-04-03 DIAGNOSIS — F4311 Post-traumatic stress disorder, acute: Secondary | ICD-10-CM | POA: Diagnosis not present

## 2020-04-10 DIAGNOSIS — F339 Major depressive disorder, recurrent, unspecified: Secondary | ICD-10-CM | POA: Diagnosis not present

## 2020-04-10 DIAGNOSIS — F4311 Post-traumatic stress disorder, acute: Secondary | ICD-10-CM | POA: Diagnosis not present

## 2020-04-21 DIAGNOSIS — Z634 Disappearance and death of family member: Secondary | ICD-10-CM | POA: Diagnosis not present

## 2020-04-21 DIAGNOSIS — F4311 Post-traumatic stress disorder, acute: Secondary | ICD-10-CM | POA: Diagnosis not present

## 2020-04-21 DIAGNOSIS — F339 Major depressive disorder, recurrent, unspecified: Secondary | ICD-10-CM | POA: Diagnosis not present

## 2020-04-30 DIAGNOSIS — Z634 Disappearance and death of family member: Secondary | ICD-10-CM | POA: Diagnosis not present

## 2020-04-30 DIAGNOSIS — F4311 Post-traumatic stress disorder, acute: Secondary | ICD-10-CM | POA: Diagnosis not present

## 2020-04-30 DIAGNOSIS — F339 Major depressive disorder, recurrent, unspecified: Secondary | ICD-10-CM | POA: Diagnosis not present

## 2020-05-05 DIAGNOSIS — Z634 Disappearance and death of family member: Secondary | ICD-10-CM | POA: Diagnosis not present

## 2020-05-05 DIAGNOSIS — F339 Major depressive disorder, recurrent, unspecified: Secondary | ICD-10-CM | POA: Diagnosis not present

## 2020-05-05 DIAGNOSIS — F4311 Post-traumatic stress disorder, acute: Secondary | ICD-10-CM | POA: Diagnosis not present

## 2020-05-12 DIAGNOSIS — Z634 Disappearance and death of family member: Secondary | ICD-10-CM | POA: Diagnosis not present

## 2020-05-12 DIAGNOSIS — F4311 Post-traumatic stress disorder, acute: Secondary | ICD-10-CM | POA: Diagnosis not present

## 2020-05-12 DIAGNOSIS — F339 Major depressive disorder, recurrent, unspecified: Secondary | ICD-10-CM | POA: Diagnosis not present

## 2020-05-16 DIAGNOSIS — Z789 Other specified health status: Secondary | ICD-10-CM | POA: Diagnosis not present

## 2020-05-16 DIAGNOSIS — J0140 Acute pansinusitis, unspecified: Secondary | ICD-10-CM | POA: Diagnosis not present

## 2020-05-16 DIAGNOSIS — J302 Other seasonal allergic rhinitis: Secondary | ICD-10-CM | POA: Diagnosis not present

## 2020-05-16 DIAGNOSIS — J4530 Mild persistent asthma, uncomplicated: Secondary | ICD-10-CM | POA: Diagnosis not present

## 2020-05-20 DIAGNOSIS — Z6841 Body Mass Index (BMI) 40.0 and over, adult: Secondary | ICD-10-CM | POA: Diagnosis not present

## 2020-05-20 DIAGNOSIS — F330 Major depressive disorder, recurrent, mild: Secondary | ICD-10-CM | POA: Diagnosis not present

## 2020-05-20 DIAGNOSIS — F4321 Adjustment disorder with depressed mood: Secondary | ICD-10-CM | POA: Diagnosis not present

## 2020-05-20 DIAGNOSIS — R635 Abnormal weight gain: Secondary | ICD-10-CM | POA: Diagnosis not present

## 2020-05-22 DIAGNOSIS — F332 Major depressive disorder, recurrent severe without psychotic features: Secondary | ICD-10-CM | POA: Diagnosis not present

## 2020-06-03 DIAGNOSIS — F331 Major depressive disorder, recurrent, moderate: Secondary | ICD-10-CM | POA: Diagnosis not present

## 2020-06-03 DIAGNOSIS — J4530 Mild persistent asthma, uncomplicated: Secondary | ICD-10-CM | POA: Diagnosis not present

## 2020-06-03 DIAGNOSIS — D649 Anemia, unspecified: Secondary | ICD-10-CM | POA: Diagnosis not present

## 2020-06-03 DIAGNOSIS — F4321 Adjustment disorder with depressed mood: Secondary | ICD-10-CM | POA: Diagnosis not present

## 2020-06-03 DIAGNOSIS — N920 Excessive and frequent menstruation with regular cycle: Secondary | ICD-10-CM | POA: Diagnosis not present

## 2020-06-03 DIAGNOSIS — Z6841 Body Mass Index (BMI) 40.0 and over, adult: Secondary | ICD-10-CM | POA: Diagnosis not present

## 2020-06-24 DIAGNOSIS — F332 Major depressive disorder, recurrent severe without psychotic features: Secondary | ICD-10-CM | POA: Diagnosis not present

## 2020-06-26 DIAGNOSIS — Z1231 Encounter for screening mammogram for malignant neoplasm of breast: Secondary | ICD-10-CM | POA: Diagnosis not present

## 2020-06-26 DIAGNOSIS — Z113 Encounter for screening for infections with a predominantly sexual mode of transmission: Secondary | ICD-10-CM | POA: Diagnosis not present

## 2020-06-26 DIAGNOSIS — Z1151 Encounter for screening for human papillomavirus (HPV): Secondary | ICD-10-CM | POA: Diagnosis not present

## 2020-06-26 DIAGNOSIS — Z124 Encounter for screening for malignant neoplasm of cervix: Secondary | ICD-10-CM | POA: Diagnosis not present

## 2020-07-14 DIAGNOSIS — F332 Major depressive disorder, recurrent severe without psychotic features: Secondary | ICD-10-CM | POA: Diagnosis not present

## 2020-07-14 DIAGNOSIS — Z634 Disappearance and death of family member: Secondary | ICD-10-CM | POA: Diagnosis not present

## 2020-07-15 DIAGNOSIS — F332 Major depressive disorder, recurrent severe without psychotic features: Secondary | ICD-10-CM | POA: Diagnosis not present

## 2020-07-15 DIAGNOSIS — F4310 Post-traumatic stress disorder, unspecified: Secondary | ICD-10-CM | POA: Diagnosis not present

## 2020-07-24 DIAGNOSIS — F332 Major depressive disorder, recurrent severe without psychotic features: Secondary | ICD-10-CM | POA: Diagnosis not present

## 2020-07-24 DIAGNOSIS — Z634 Disappearance and death of family member: Secondary | ICD-10-CM | POA: Diagnosis not present

## 2020-07-28 DIAGNOSIS — Z6841 Body Mass Index (BMI) 40.0 and over, adult: Secondary | ICD-10-CM | POA: Diagnosis not present

## 2020-07-28 DIAGNOSIS — L729 Follicular cyst of the skin and subcutaneous tissue, unspecified: Secondary | ICD-10-CM | POA: Diagnosis not present

## 2020-07-28 DIAGNOSIS — I10 Essential (primary) hypertension: Secondary | ICD-10-CM | POA: Diagnosis not present

## 2020-07-28 DIAGNOSIS — Z789 Other specified health status: Secondary | ICD-10-CM | POA: Diagnosis not present

## 2020-07-31 DIAGNOSIS — L989 Disorder of the skin and subcutaneous tissue, unspecified: Secondary | ICD-10-CM | POA: Diagnosis not present

## 2020-07-31 DIAGNOSIS — L7211 Pilar cyst: Secondary | ICD-10-CM | POA: Diagnosis not present

## 2020-08-03 DIAGNOSIS — F332 Major depressive disorder, recurrent severe without psychotic features: Secondary | ICD-10-CM | POA: Diagnosis not present

## 2020-08-03 DIAGNOSIS — F4310 Post-traumatic stress disorder, unspecified: Secondary | ICD-10-CM | POA: Diagnosis not present

## 2020-08-06 DIAGNOSIS — Z634 Disappearance and death of family member: Secondary | ICD-10-CM | POA: Diagnosis not present

## 2020-08-06 DIAGNOSIS — F332 Major depressive disorder, recurrent severe without psychotic features: Secondary | ICD-10-CM | POA: Diagnosis not present

## 2020-08-11 DIAGNOSIS — I10 Essential (primary) hypertension: Secondary | ICD-10-CM | POA: Diagnosis not present

## 2020-08-11 DIAGNOSIS — Z6841 Body Mass Index (BMI) 40.0 and over, adult: Secondary | ICD-10-CM | POA: Diagnosis not present

## 2020-08-11 DIAGNOSIS — J45909 Unspecified asthma, uncomplicated: Secondary | ICD-10-CM | POA: Diagnosis not present

## 2020-08-11 DIAGNOSIS — T753XXA Motion sickness, initial encounter: Secondary | ICD-10-CM | POA: Diagnosis not present

## 2020-08-13 DIAGNOSIS — Z634 Disappearance and death of family member: Secondary | ICD-10-CM | POA: Diagnosis not present

## 2020-08-13 DIAGNOSIS — F332 Major depressive disorder, recurrent severe without psychotic features: Secondary | ICD-10-CM | POA: Diagnosis not present

## 2020-08-18 DIAGNOSIS — F332 Major depressive disorder, recurrent severe without psychotic features: Secondary | ICD-10-CM | POA: Diagnosis not present

## 2020-08-18 DIAGNOSIS — Z634 Disappearance and death of family member: Secondary | ICD-10-CM | POA: Diagnosis not present

## 2020-08-19 DIAGNOSIS — Z20828 Contact with and (suspected) exposure to other viral communicable diseases: Secondary | ICD-10-CM | POA: Diagnosis not present

## 2020-08-20 DIAGNOSIS — F332 Major depressive disorder, recurrent severe without psychotic features: Secondary | ICD-10-CM | POA: Diagnosis not present

## 2020-09-03 DIAGNOSIS — J0120 Acute ethmoidal sinusitis, unspecified: Secondary | ICD-10-CM | POA: Diagnosis not present

## 2020-09-03 DIAGNOSIS — Z6841 Body Mass Index (BMI) 40.0 and over, adult: Secondary | ICD-10-CM | POA: Diagnosis not present

## 2020-09-03 DIAGNOSIS — I10 Essential (primary) hypertension: Secondary | ICD-10-CM | POA: Diagnosis not present

## 2020-09-03 DIAGNOSIS — R110 Nausea: Secondary | ICD-10-CM | POA: Diagnosis not present

## 2020-09-03 DIAGNOSIS — J4541 Moderate persistent asthma with (acute) exacerbation: Secondary | ICD-10-CM | POA: Diagnosis not present

## 2020-09-03 DIAGNOSIS — Z1152 Encounter for screening for COVID-19: Secondary | ICD-10-CM | POA: Diagnosis not present

## 2020-09-03 DIAGNOSIS — Z7951 Long term (current) use of inhaled steroids: Secondary | ICD-10-CM | POA: Diagnosis not present

## 2020-09-07 DIAGNOSIS — F332 Major depressive disorder, recurrent severe without psychotic features: Secondary | ICD-10-CM | POA: Diagnosis not present

## 2020-09-08 DIAGNOSIS — F332 Major depressive disorder, recurrent severe without psychotic features: Secondary | ICD-10-CM | POA: Diagnosis not present

## 2020-09-09 DIAGNOSIS — F332 Major depressive disorder, recurrent severe without psychotic features: Secondary | ICD-10-CM | POA: Diagnosis not present

## 2020-09-10 DIAGNOSIS — F332 Major depressive disorder, recurrent severe without psychotic features: Secondary | ICD-10-CM | POA: Diagnosis not present

## 2020-09-10 DIAGNOSIS — Z634 Disappearance and death of family member: Secondary | ICD-10-CM | POA: Diagnosis not present

## 2020-09-11 DIAGNOSIS — F332 Major depressive disorder, recurrent severe without psychotic features: Secondary | ICD-10-CM | POA: Diagnosis not present

## 2020-09-14 DIAGNOSIS — F332 Major depressive disorder, recurrent severe without psychotic features: Secondary | ICD-10-CM | POA: Diagnosis not present

## 2020-09-15 DIAGNOSIS — F332 Major depressive disorder, recurrent severe without psychotic features: Secondary | ICD-10-CM | POA: Diagnosis not present

## 2020-09-16 DIAGNOSIS — F332 Major depressive disorder, recurrent severe without psychotic features: Secondary | ICD-10-CM | POA: Diagnosis not present

## 2020-09-17 DIAGNOSIS — F332 Major depressive disorder, recurrent severe without psychotic features: Secondary | ICD-10-CM | POA: Diagnosis not present

## 2020-09-21 DIAGNOSIS — F332 Major depressive disorder, recurrent severe without psychotic features: Secondary | ICD-10-CM | POA: Diagnosis not present

## 2020-09-22 DIAGNOSIS — F332 Major depressive disorder, recurrent severe without psychotic features: Secondary | ICD-10-CM | POA: Diagnosis not present

## 2020-09-23 DIAGNOSIS — F332 Major depressive disorder, recurrent severe without psychotic features: Secondary | ICD-10-CM | POA: Diagnosis not present

## 2020-09-24 DIAGNOSIS — F332 Major depressive disorder, recurrent severe without psychotic features: Secondary | ICD-10-CM | POA: Diagnosis not present

## 2020-09-24 DIAGNOSIS — Z634 Disappearance and death of family member: Secondary | ICD-10-CM | POA: Diagnosis not present

## 2020-09-25 DIAGNOSIS — F332 Major depressive disorder, recurrent severe without psychotic features: Secondary | ICD-10-CM | POA: Diagnosis not present

## 2020-09-28 DIAGNOSIS — F332 Major depressive disorder, recurrent severe without psychotic features: Secondary | ICD-10-CM | POA: Diagnosis not present

## 2020-09-29 DIAGNOSIS — F332 Major depressive disorder, recurrent severe without psychotic features: Secondary | ICD-10-CM | POA: Diagnosis not present

## 2020-10-01 DIAGNOSIS — F332 Major depressive disorder, recurrent severe without psychotic features: Secondary | ICD-10-CM | POA: Diagnosis not present

## 2020-10-02 DIAGNOSIS — F332 Major depressive disorder, recurrent severe without psychotic features: Secondary | ICD-10-CM | POA: Diagnosis not present

## 2020-10-05 DIAGNOSIS — F332 Major depressive disorder, recurrent severe without psychotic features: Secondary | ICD-10-CM | POA: Diagnosis not present

## 2020-10-06 DIAGNOSIS — F332 Major depressive disorder, recurrent severe without psychotic features: Secondary | ICD-10-CM | POA: Diagnosis not present

## 2020-10-07 DIAGNOSIS — Z634 Disappearance and death of family member: Secondary | ICD-10-CM | POA: Diagnosis not present

## 2020-10-07 DIAGNOSIS — F332 Major depressive disorder, recurrent severe without psychotic features: Secondary | ICD-10-CM | POA: Diagnosis not present

## 2020-10-09 DIAGNOSIS — F332 Major depressive disorder, recurrent severe without psychotic features: Secondary | ICD-10-CM | POA: Diagnosis not present

## 2020-10-12 DIAGNOSIS — F332 Major depressive disorder, recurrent severe without psychotic features: Secondary | ICD-10-CM | POA: Diagnosis not present

## 2020-10-13 DIAGNOSIS — F332 Major depressive disorder, recurrent severe without psychotic features: Secondary | ICD-10-CM | POA: Diagnosis not present

## 2020-10-14 DIAGNOSIS — F332 Major depressive disorder, recurrent severe without psychotic features: Secondary | ICD-10-CM | POA: Diagnosis not present

## 2020-10-15 DIAGNOSIS — F332 Major depressive disorder, recurrent severe without psychotic features: Secondary | ICD-10-CM | POA: Diagnosis not present

## 2020-10-15 DIAGNOSIS — Z634 Disappearance and death of family member: Secondary | ICD-10-CM | POA: Diagnosis not present

## 2020-10-16 DIAGNOSIS — F332 Major depressive disorder, recurrent severe without psychotic features: Secondary | ICD-10-CM | POA: Diagnosis not present

## 2020-10-19 DIAGNOSIS — F332 Major depressive disorder, recurrent severe without psychotic features: Secondary | ICD-10-CM | POA: Diagnosis not present

## 2020-10-20 DIAGNOSIS — F332 Major depressive disorder, recurrent severe without psychotic features: Secondary | ICD-10-CM | POA: Diagnosis not present

## 2020-10-22 DIAGNOSIS — Z634 Disappearance and death of family member: Secondary | ICD-10-CM | POA: Diagnosis not present

## 2020-10-22 DIAGNOSIS — F332 Major depressive disorder, recurrent severe without psychotic features: Secondary | ICD-10-CM | POA: Diagnosis not present

## 2020-10-23 DIAGNOSIS — F332 Major depressive disorder, recurrent severe without psychotic features: Secondary | ICD-10-CM | POA: Diagnosis not present

## 2020-10-26 DIAGNOSIS — F332 Major depressive disorder, recurrent severe without psychotic features: Secondary | ICD-10-CM | POA: Diagnosis not present

## 2020-10-27 DIAGNOSIS — F332 Major depressive disorder, recurrent severe without psychotic features: Secondary | ICD-10-CM | POA: Diagnosis not present

## 2020-10-28 DIAGNOSIS — F332 Major depressive disorder, recurrent severe without psychotic features: Secondary | ICD-10-CM | POA: Diagnosis not present

## 2020-10-29 DIAGNOSIS — F332 Major depressive disorder, recurrent severe without psychotic features: Secondary | ICD-10-CM | POA: Diagnosis not present

## 2020-10-29 DIAGNOSIS — Z634 Disappearance and death of family member: Secondary | ICD-10-CM | POA: Diagnosis not present

## 2020-10-30 DIAGNOSIS — F332 Major depressive disorder, recurrent severe without psychotic features: Secondary | ICD-10-CM | POA: Diagnosis not present

## 2020-11-02 DIAGNOSIS — F332 Major depressive disorder, recurrent severe without psychotic features: Secondary | ICD-10-CM | POA: Diagnosis not present

## 2020-11-03 DIAGNOSIS — F332 Major depressive disorder, recurrent severe without psychotic features: Secondary | ICD-10-CM | POA: Diagnosis not present

## 2020-11-04 DIAGNOSIS — F332 Major depressive disorder, recurrent severe without psychotic features: Secondary | ICD-10-CM | POA: Diagnosis not present

## 2020-11-05 DIAGNOSIS — F332 Major depressive disorder, recurrent severe without psychotic features: Secondary | ICD-10-CM | POA: Diagnosis not present

## 2020-11-05 DIAGNOSIS — Z634 Disappearance and death of family member: Secondary | ICD-10-CM | POA: Diagnosis not present

## 2020-11-10 DIAGNOSIS — F332 Major depressive disorder, recurrent severe without psychotic features: Secondary | ICD-10-CM | POA: Diagnosis not present

## 2020-11-11 DIAGNOSIS — F332 Major depressive disorder, recurrent severe without psychotic features: Secondary | ICD-10-CM | POA: Diagnosis not present

## 2020-11-12 DIAGNOSIS — F332 Major depressive disorder, recurrent severe without psychotic features: Secondary | ICD-10-CM | POA: Diagnosis not present

## 2020-11-13 DIAGNOSIS — F332 Major depressive disorder, recurrent severe without psychotic features: Secondary | ICD-10-CM | POA: Diagnosis not present

## 2020-11-16 DIAGNOSIS — N898 Other specified noninflammatory disorders of vagina: Secondary | ICD-10-CM | POA: Diagnosis not present

## 2020-11-16 DIAGNOSIS — Z7951 Long term (current) use of inhaled steroids: Secondary | ICD-10-CM | POA: Diagnosis not present

## 2020-11-16 DIAGNOSIS — R350 Frequency of micturition: Secondary | ICD-10-CM | POA: Diagnosis not present

## 2020-11-16 DIAGNOSIS — R3915 Urgency of urination: Secondary | ICD-10-CM | POA: Diagnosis not present

## 2020-11-16 DIAGNOSIS — J4530 Mild persistent asthma, uncomplicated: Secondary | ICD-10-CM | POA: Diagnosis not present

## 2020-11-17 DIAGNOSIS — F332 Major depressive disorder, recurrent severe without psychotic features: Secondary | ICD-10-CM | POA: Diagnosis not present

## 2020-11-26 DIAGNOSIS — F332 Major depressive disorder, recurrent severe without psychotic features: Secondary | ICD-10-CM | POA: Diagnosis not present

## 2020-11-26 DIAGNOSIS — Z634 Disappearance and death of family member: Secondary | ICD-10-CM | POA: Diagnosis not present

## 2020-12-09 DIAGNOSIS — F332 Major depressive disorder, recurrent severe without psychotic features: Secondary | ICD-10-CM | POA: Diagnosis not present

## 2020-12-09 DIAGNOSIS — F4310 Post-traumatic stress disorder, unspecified: Secondary | ICD-10-CM | POA: Diagnosis not present

## 2020-12-18 DIAGNOSIS — F332 Major depressive disorder, recurrent severe without psychotic features: Secondary | ICD-10-CM | POA: Diagnosis not present

## 2020-12-18 DIAGNOSIS — Z634 Disappearance and death of family member: Secondary | ICD-10-CM | POA: Diagnosis not present

## 2020-12-23 DIAGNOSIS — R350 Frequency of micturition: Secondary | ICD-10-CM | POA: Diagnosis not present

## 2020-12-23 DIAGNOSIS — R7303 Prediabetes: Secondary | ICD-10-CM | POA: Diagnosis not present

## 2020-12-23 DIAGNOSIS — N898 Other specified noninflammatory disorders of vagina: Secondary | ICD-10-CM | POA: Diagnosis not present

## 2020-12-24 DIAGNOSIS — R350 Frequency of micturition: Secondary | ICD-10-CM | POA: Diagnosis not present

## 2020-12-25 DIAGNOSIS — F332 Major depressive disorder, recurrent severe without psychotic features: Secondary | ICD-10-CM | POA: Diagnosis not present

## 2020-12-25 DIAGNOSIS — Z634 Disappearance and death of family member: Secondary | ICD-10-CM | POA: Diagnosis not present

## 2020-12-28 DIAGNOSIS — F32A Depression, unspecified: Secondary | ICD-10-CM | POA: Diagnosis not present

## 2020-12-28 DIAGNOSIS — Z7289 Other problems related to lifestyle: Secondary | ICD-10-CM | POA: Diagnosis not present

## 2020-12-28 DIAGNOSIS — R635 Abnormal weight gain: Secondary | ICD-10-CM | POA: Diagnosis not present

## 2020-12-28 DIAGNOSIS — E669 Obesity, unspecified: Secondary | ICD-10-CM | POA: Diagnosis not present

## 2020-12-31 DIAGNOSIS — F332 Major depressive disorder, recurrent severe without psychotic features: Secondary | ICD-10-CM | POA: Diagnosis not present

## 2020-12-31 DIAGNOSIS — Z634 Disappearance and death of family member: Secondary | ICD-10-CM | POA: Diagnosis not present

## 2021-01-07 DIAGNOSIS — F332 Major depressive disorder, recurrent severe without psychotic features: Secondary | ICD-10-CM | POA: Diagnosis not present

## 2021-01-07 DIAGNOSIS — Z634 Disappearance and death of family member: Secondary | ICD-10-CM | POA: Diagnosis not present

## 2021-01-14 DIAGNOSIS — Z634 Disappearance and death of family member: Secondary | ICD-10-CM | POA: Diagnosis not present

## 2021-01-14 DIAGNOSIS — F4310 Post-traumatic stress disorder, unspecified: Secondary | ICD-10-CM | POA: Diagnosis not present

## 2021-01-14 DIAGNOSIS — F332 Major depressive disorder, recurrent severe without psychotic features: Secondary | ICD-10-CM | POA: Diagnosis not present

## 2021-01-22 DIAGNOSIS — Z634 Disappearance and death of family member: Secondary | ICD-10-CM | POA: Diagnosis not present

## 2021-01-22 DIAGNOSIS — F332 Major depressive disorder, recurrent severe without psychotic features: Secondary | ICD-10-CM | POA: Diagnosis not present

## 2021-01-29 DIAGNOSIS — Z634 Disappearance and death of family member: Secondary | ICD-10-CM | POA: Diagnosis not present

## 2021-01-29 DIAGNOSIS — F332 Major depressive disorder, recurrent severe without psychotic features: Secondary | ICD-10-CM | POA: Diagnosis not present

## 2021-02-05 DIAGNOSIS — F332 Major depressive disorder, recurrent severe without psychotic features: Secondary | ICD-10-CM | POA: Diagnosis not present

## 2021-02-05 DIAGNOSIS — Z634 Disappearance and death of family member: Secondary | ICD-10-CM | POA: Diagnosis not present

## 2021-02-19 DIAGNOSIS — F332 Major depressive disorder, recurrent severe without psychotic features: Secondary | ICD-10-CM | POA: Diagnosis not present

## 2021-02-19 DIAGNOSIS — Z634 Disappearance and death of family member: Secondary | ICD-10-CM | POA: Diagnosis not present

## 2021-02-22 DIAGNOSIS — F4310 Post-traumatic stress disorder, unspecified: Secondary | ICD-10-CM | POA: Diagnosis not present

## 2021-02-22 DIAGNOSIS — F332 Major depressive disorder, recurrent severe without psychotic features: Secondary | ICD-10-CM | POA: Diagnosis not present

## 2021-02-23 DIAGNOSIS — I10 Essential (primary) hypertension: Secondary | ICD-10-CM | POA: Diagnosis not present

## 2021-02-23 DIAGNOSIS — F32A Depression, unspecified: Secondary | ICD-10-CM | POA: Diagnosis not present

## 2021-02-23 DIAGNOSIS — R635 Abnormal weight gain: Secondary | ICD-10-CM | POA: Diagnosis not present

## 2021-02-23 DIAGNOSIS — E669 Obesity, unspecified: Secondary | ICD-10-CM | POA: Diagnosis not present

## 2021-02-26 DIAGNOSIS — F332 Major depressive disorder, recurrent severe without psychotic features: Secondary | ICD-10-CM | POA: Diagnosis not present

## 2021-02-26 DIAGNOSIS — Z634 Disappearance and death of family member: Secondary | ICD-10-CM | POA: Diagnosis not present

## 2021-03-02 DIAGNOSIS — Z79899 Other long term (current) drug therapy: Secondary | ICD-10-CM | POA: Diagnosis not present

## 2021-03-02 DIAGNOSIS — R635 Abnormal weight gain: Secondary | ICD-10-CM | POA: Diagnosis not present

## 2021-03-02 DIAGNOSIS — D649 Anemia, unspecified: Secondary | ICD-10-CM | POA: Diagnosis not present

## 2021-03-02 DIAGNOSIS — E669 Obesity, unspecified: Secondary | ICD-10-CM | POA: Diagnosis not present

## 2021-03-02 DIAGNOSIS — I10 Essential (primary) hypertension: Secondary | ICD-10-CM | POA: Diagnosis not present

## 2021-03-05 DIAGNOSIS — F332 Major depressive disorder, recurrent severe without psychotic features: Secondary | ICD-10-CM | POA: Diagnosis not present

## 2021-03-05 DIAGNOSIS — Z634 Disappearance and death of family member: Secondary | ICD-10-CM | POA: Diagnosis not present

## 2021-03-12 DIAGNOSIS — Z634 Disappearance and death of family member: Secondary | ICD-10-CM | POA: Diagnosis not present

## 2021-03-12 DIAGNOSIS — F332 Major depressive disorder, recurrent severe without psychotic features: Secondary | ICD-10-CM | POA: Diagnosis not present

## 2021-03-19 DIAGNOSIS — F332 Major depressive disorder, recurrent severe without psychotic features: Secondary | ICD-10-CM | POA: Diagnosis not present

## 2021-03-19 DIAGNOSIS — Z634 Disappearance and death of family member: Secondary | ICD-10-CM | POA: Diagnosis not present

## 2021-03-25 DIAGNOSIS — F332 Major depressive disorder, recurrent severe without psychotic features: Secondary | ICD-10-CM | POA: Diagnosis not present

## 2021-03-25 DIAGNOSIS — F4310 Post-traumatic stress disorder, unspecified: Secondary | ICD-10-CM | POA: Diagnosis not present

## 2021-03-30 DIAGNOSIS — R635 Abnormal weight gain: Secondary | ICD-10-CM | POA: Diagnosis not present

## 2021-03-30 DIAGNOSIS — I10 Essential (primary) hypertension: Secondary | ICD-10-CM | POA: Diagnosis not present

## 2021-03-30 DIAGNOSIS — E669 Obesity, unspecified: Secondary | ICD-10-CM | POA: Diagnosis not present

## 2021-03-30 DIAGNOSIS — F32A Depression, unspecified: Secondary | ICD-10-CM | POA: Diagnosis not present

## 2021-04-02 DIAGNOSIS — Z634 Disappearance and death of family member: Secondary | ICD-10-CM | POA: Diagnosis not present

## 2021-04-02 DIAGNOSIS — F332 Major depressive disorder, recurrent severe without psychotic features: Secondary | ICD-10-CM | POA: Diagnosis not present

## 2021-04-14 DIAGNOSIS — F4310 Post-traumatic stress disorder, unspecified: Secondary | ICD-10-CM | POA: Diagnosis not present

## 2021-04-14 DIAGNOSIS — F332 Major depressive disorder, recurrent severe without psychotic features: Secondary | ICD-10-CM | POA: Diagnosis not present

## 2021-04-16 DIAGNOSIS — Z634 Disappearance and death of family member: Secondary | ICD-10-CM | POA: Diagnosis not present

## 2021-04-16 DIAGNOSIS — F332 Major depressive disorder, recurrent severe without psychotic features: Secondary | ICD-10-CM | POA: Diagnosis not present

## 2021-04-23 DIAGNOSIS — Z634 Disappearance and death of family member: Secondary | ICD-10-CM | POA: Diagnosis not present

## 2021-04-23 DIAGNOSIS — F332 Major depressive disorder, recurrent severe without psychotic features: Secondary | ICD-10-CM | POA: Diagnosis not present

## 2021-05-04 DIAGNOSIS — R635 Abnormal weight gain: Secondary | ICD-10-CM | POA: Diagnosis not present

## 2021-05-04 DIAGNOSIS — R7303 Prediabetes: Secondary | ICD-10-CM | POA: Diagnosis not present

## 2021-05-04 DIAGNOSIS — E669 Obesity, unspecified: Secondary | ICD-10-CM | POA: Diagnosis not present

## 2021-05-04 DIAGNOSIS — F32A Depression, unspecified: Secondary | ICD-10-CM | POA: Diagnosis not present

## 2021-05-07 DIAGNOSIS — F332 Major depressive disorder, recurrent severe without psychotic features: Secondary | ICD-10-CM | POA: Diagnosis not present

## 2021-05-07 DIAGNOSIS — Z634 Disappearance and death of family member: Secondary | ICD-10-CM | POA: Diagnosis not present

## 2021-05-14 DIAGNOSIS — Z634 Disappearance and death of family member: Secondary | ICD-10-CM | POA: Diagnosis not present

## 2021-05-14 DIAGNOSIS — F332 Major depressive disorder, recurrent severe without psychotic features: Secondary | ICD-10-CM | POA: Diagnosis not present

## 2021-05-21 DIAGNOSIS — Z634 Disappearance and death of family member: Secondary | ICD-10-CM | POA: Diagnosis not present

## 2021-05-21 DIAGNOSIS — F332 Major depressive disorder, recurrent severe without psychotic features: Secondary | ICD-10-CM | POA: Diagnosis not present

## 2021-06-04 DIAGNOSIS — Z634 Disappearance and death of family member: Secondary | ICD-10-CM | POA: Diagnosis not present

## 2021-06-04 DIAGNOSIS — F332 Major depressive disorder, recurrent severe without psychotic features: Secondary | ICD-10-CM | POA: Diagnosis not present

## 2021-06-25 DIAGNOSIS — I10 Essential (primary) hypertension: Secondary | ICD-10-CM | POA: Diagnosis not present

## 2021-06-25 DIAGNOSIS — J4530 Mild persistent asthma, uncomplicated: Secondary | ICD-10-CM | POA: Diagnosis not present

## 2021-06-29 DIAGNOSIS — F332 Major depressive disorder, recurrent severe without psychotic features: Secondary | ICD-10-CM | POA: Diagnosis not present

## 2021-07-01 DIAGNOSIS — F332 Major depressive disorder, recurrent severe without psychotic features: Secondary | ICD-10-CM | POA: Diagnosis not present

## 2021-07-05 DIAGNOSIS — F332 Major depressive disorder, recurrent severe without psychotic features: Secondary | ICD-10-CM | POA: Diagnosis not present

## 2021-07-08 DIAGNOSIS — F332 Major depressive disorder, recurrent severe without psychotic features: Secondary | ICD-10-CM | POA: Diagnosis not present

## 2021-07-13 DIAGNOSIS — I10 Essential (primary) hypertension: Secondary | ICD-10-CM | POA: Diagnosis not present

## 2021-07-13 DIAGNOSIS — F32A Depression, unspecified: Secondary | ICD-10-CM | POA: Diagnosis not present

## 2021-07-13 DIAGNOSIS — E669 Obesity, unspecified: Secondary | ICD-10-CM | POA: Diagnosis not present

## 2021-07-13 DIAGNOSIS — F332 Major depressive disorder, recurrent severe without psychotic features: Secondary | ICD-10-CM | POA: Diagnosis not present

## 2021-07-13 DIAGNOSIS — R7303 Prediabetes: Secondary | ICD-10-CM | POA: Diagnosis not present

## 2021-07-15 DIAGNOSIS — F332 Major depressive disorder, recurrent severe without psychotic features: Secondary | ICD-10-CM | POA: Diagnosis not present

## 2021-07-20 DIAGNOSIS — F332 Major depressive disorder, recurrent severe without psychotic features: Secondary | ICD-10-CM | POA: Diagnosis not present

## 2021-07-22 DIAGNOSIS — F332 Major depressive disorder, recurrent severe without psychotic features: Secondary | ICD-10-CM | POA: Diagnosis not present

## 2021-07-27 DIAGNOSIS — F332 Major depressive disorder, recurrent severe without psychotic features: Secondary | ICD-10-CM | POA: Diagnosis not present

## 2021-08-03 DIAGNOSIS — F332 Major depressive disorder, recurrent severe without psychotic features: Secondary | ICD-10-CM | POA: Diagnosis not present

## 2021-08-13 DIAGNOSIS — F332 Major depressive disorder, recurrent severe without psychotic features: Secondary | ICD-10-CM | POA: Diagnosis not present

## 2021-08-24 DIAGNOSIS — F332 Major depressive disorder, recurrent severe without psychotic features: Secondary | ICD-10-CM | POA: Diagnosis not present

## 2021-09-02 DIAGNOSIS — F332 Major depressive disorder, recurrent severe without psychotic features: Secondary | ICD-10-CM | POA: Diagnosis not present

## 2021-09-14 DIAGNOSIS — F332 Major depressive disorder, recurrent severe without psychotic features: Secondary | ICD-10-CM | POA: Diagnosis not present

## 2021-09-23 DIAGNOSIS — F332 Major depressive disorder, recurrent severe without psychotic features: Secondary | ICD-10-CM | POA: Diagnosis not present

## 2021-09-30 DIAGNOSIS — F332 Major depressive disorder, recurrent severe without psychotic features: Secondary | ICD-10-CM | POA: Diagnosis not present

## 2021-10-06 DIAGNOSIS — F332 Major depressive disorder, recurrent severe without psychotic features: Secondary | ICD-10-CM | POA: Diagnosis not present

## 2021-10-28 DIAGNOSIS — F332 Major depressive disorder, recurrent severe without psychotic features: Secondary | ICD-10-CM | POA: Diagnosis not present
